# Patient Record
Sex: Female | Born: 1990 | Race: White | Hispanic: No | Marital: Married | Smoking: Former smoker
Health system: Southern US, Community
[De-identification: ages and names within clinical notes are randomized; demographics above are authoritative.]

## PROBLEM LIST (undated history)

## (undated) DIAGNOSIS — F419 Anxiety disorder, unspecified: Secondary | ICD-10-CM

## (undated) DIAGNOSIS — G43909 Migraine, unspecified, not intractable, without status migrainosus: Secondary | ICD-10-CM

## (undated) HISTORY — PX: TUBAL LIGATION: SHX77

## (undated) HISTORY — DX: Migraine, unspecified, not intractable, without status migrainosus: G43.909

## (undated) HISTORY — PX: ABDOMINAL HYSTERECTOMY: SHX81

---
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
## 2011-02-13 ENCOUNTER — Emergency Department (HOSPITAL_BASED_OUTPATIENT_CLINIC_OR_DEPARTMENT_OTHER)
Admission: EM | Admit: 2011-02-13 | Discharge: 2011-02-13 | Disposition: A | Discharge status: Home / Self Care | Payer: Medicaid Other | Attending: Emergency Medicine | Admitting: Emergency Medicine

## 2011-02-13 ENCOUNTER — Emergency Department (INDEPENDENT_AMBULATORY_CARE_PROVIDER_SITE_OTHER): Payer: Medicaid Other

## 2011-02-13 ENCOUNTER — Encounter: Payer: Self-pay | Admitting: *Deleted

## 2011-02-13 DIAGNOSIS — O208 Other hemorrhage in early pregnancy: Secondary | ICD-10-CM

## 2011-02-13 DIAGNOSIS — O2690 Pregnancy related conditions, unspecified, unspecified trimester: Secondary | ICD-10-CM | POA: Insufficient documentation

## 2011-02-13 DIAGNOSIS — O26899 Other specified pregnancy related conditions, unspecified trimester: Secondary | ICD-10-CM

## 2011-02-13 DIAGNOSIS — R109 Unspecified abdominal pain: Secondary | ICD-10-CM | POA: Insufficient documentation

## 2011-02-13 DIAGNOSIS — F17200 Nicotine dependence, unspecified, uncomplicated: Secondary | ICD-10-CM | POA: Insufficient documentation

## 2011-02-13 LAB — URINALYSIS, ROUTINE W REFLEX MICROSCOPIC
Bilirubin Urine: NEGATIVE
Glucose, UA: NEGATIVE mg/dL
Hgb urine dipstick: NEGATIVE
Ketones, ur: NEGATIVE mg/dL
Leukocytes, UA: NEGATIVE
Nitrite: NEGATIVE
Protein, ur: 30 mg/dL — AB
Specific Gravity, Urine: 1.028 (ref 1.005–1.030)
Urobilinogen, UA: 0.2 mg/dL (ref 0.0–1.0)
pH: 5.5 (ref 5.0–8.0)

## 2011-02-13 LAB — WET PREP, GENITAL

## 2011-02-13 LAB — CBC
Hemoglobin: 13.5 g/dL (ref 12.0–15.0)
MCH: 30.4 pg (ref 26.0–34.0)
MCV: 86.5 fL (ref 78.0–100.0)
Platelets: 197 10*3/uL (ref 150–400)
RBC: 4.44 MIL/uL (ref 3.87–5.11)
WBC: 8 10*3/uL (ref 4.0–10.5)

## 2011-02-13 MED ORDER — PRENATAL COMPLETE 14-0.4 MG PO TABS: 1.0000 | ORAL_TABLET | ORAL | Status: DC

## 2011-02-13 NOTE — ED Notes (Signed)
Pt says she has been experiencing bleeding with clots today. Positive pregnancy test last week

## 2011-02-13 NOTE — ED Provider Notes (Addendum)
History     Chief Complaint  Patient presents with  . Vaginal Bleeding   Patient is a 20 y.o. female presenting with vaginal bleeding. The history is provided by the patient.  Vaginal Bleeding This is a new problem. The current episode started today. The problem occurs constantly. The problem has been unchanged. Associated symptoms include abdominal pain. The symptoms are aggravated by nothing. She has tried nothing for the symptoms. The treatment provided no relief.  Vaginal Bleeding This is a new problem. The current episode started today. The problem occurs constantly. The problem has been unchanged. Associated symptoms include abdominal pain. The symptoms are aggravated by nothing. She has tried nothing for the symptoms. The treatment provided no relief.  Pt reports she is early pregnant.  Pt reports she was passing clots today.   History reviewed. No pertinent past medical history.  History reviewed. No pertinent past surgical history.  No family history on file.  History  Substance Use Topics  . Smoking status: Current Some Day Smoker  . Smokeless tobacco: Not on file  . Alcohol Use: Yes    OB History    Grav Para Term Preterm Abortions TAB SAB Ect Mult Living   1               Review of Systems  Gastrointestinal: Positive for abdominal pain.  Genitourinary: Positive for vaginal bleeding.    Physical Exam  BP 106/62  Pulse 63  Temp(Src) 97.8 F (36.6 C) (Oral)  Resp 16  SpO2 99%  LMP 01/04/2011  Physical Exam  Constitutional: She is oriented to person, place, and time. She appears well-developed and well-nourished.  HENT:  Head: Normocephalic.  Eyes: Pupils are equal, round, and reactive to light.  Neck: Normal range of motion.  Cardiovascular: Normal rate.   Pulmonary/Chest: Effort normal.  Abdominal: Soft. Bowel sounds are normal.  Genitourinary: Vagina normal and uterus normal.  Musculoskeletal: Normal range of motion.  Neurological: She is alert and  oriented to person, place, and time.  Skin: Skin is warm and dry.  Psychiatric: She has a normal mood and affect.    ED Course  Procedures  MDM  Ultrasound shows 5 week 5 day iup due 3/27  This pt has no bleeding on exam.  Medical screening examination/treatment/procedure(s) were performed by non-physician practitioner and as supervising physician I was immediately available for consultation/collaboration. Osvaldo Human, M.D.     [place], [place] 02/13/11 1747  Langston Masker, PA 02/13/11 1751  Langston Masker, [place] 02/13/11 1751  Carleene Cooper III, MD 02/14/11 1116  Carleene Cooper III, MD 03/24/11 1115

## 2011-02-14 LAB — GC/CHLAMYDIA PROBE AMP, GENITAL: GC Probe Amp, Genital: NEGATIVE

## 2011-03-01 ENCOUNTER — Emergency Department (HOSPITAL_BASED_OUTPATIENT_CLINIC_OR_DEPARTMENT_OTHER)
Admission: EM | Admit: 2011-03-01 | Discharge: 2011-03-01 | Disposition: A | Discharge status: Home / Self Care | Payer: Medicaid Other | Attending: Emergency Medicine | Admitting: Emergency Medicine

## 2011-03-01 ENCOUNTER — Encounter (HOSPITAL_BASED_OUTPATIENT_CLINIC_OR_DEPARTMENT_OTHER): Payer: Self-pay | Admitting: *Deleted

## 2011-03-01 DIAGNOSIS — O200 Threatened abortion: Secondary | ICD-10-CM | POA: Insufficient documentation

## 2011-03-01 LAB — URINALYSIS, ROUTINE W REFLEX MICROSCOPIC
Glucose, UA: NEGATIVE mg/dL
Ketones, ur: NEGATIVE mg/dL
Leukocytes, UA: NEGATIVE
Protein, ur: 30 mg/dL — AB
Urobilinogen, UA: 0.2 mg/dL (ref 0.0–1.0)

## 2011-03-01 LAB — WET PREP, GENITAL: Yeast Wet Prep HPF POC: NONE SEEN

## 2011-03-01 LAB — URINE MICROSCOPIC-ADD ON

## 2011-03-01 LAB — PREGNANCY, URINE: Preg Test, Ur: POSITIVE

## 2011-03-01 MED ORDER — METRONIDAZOLE 500 MG PO TABS: 500.0000 mg | ORAL_TABLET | Freq: Two times a day (BID) | ORAL | Status: AC

## 2011-03-01 MED ORDER — TRIAMCINOLONE ACETONIDE 0.025 % EX OINT: TOPICAL_OINTMENT | Freq: Two times a day (BID) | CUTANEOUS | Status: AC

## 2011-03-01 NOTE — ED Notes (Signed)
Pt st's she is [redacted] weeks pregnant took a shower around 1630 and noticed copious amounts of dark brown blood.  St's she noticed no clots.  St's she's having no pain.

## 2011-03-01 NOTE — ED Provider Notes (Signed)
History     CSN: [id number] Arrival date & time: 03/01/2011  6:04 PM  Chief Complaint  Patient presents with  . Vaginal Bleeding   HPI  Pt believes she is [redacted] weeks pregnant, although does not have an OB and IUP has not been confirmed.  Developed vaginal bleeding while in shower this am and has had some light bleeding since then.  No associated abd/pelvic pain or any other vaginal symptoms.  Had bleeding three weeks ago as well that resolved spontaneously.  Per prior chart, pt seen 02/13/11.  [place] confirmed IUP but did not have Rh status checked.    History reviewed. No pertinent past medical history.  History reviewed. No pertinent past surgical history.  No family history on file.  History  Substance Use Topics  . Smoking status: Former Games developer  . Smokeless tobacco: Not on file  . Alcohol Use: No    OB History    Grav Para Term Preterm Abortions TAB SAB Ect Mult Living   1               Review of Systems  All other systems reviewed and are negative.    Physical Exam  BP 114/72  Pulse 71  Temp(Src) 98.4 F (36.9 C) (Oral)  Resp 16  SpO2 100%  LMP 01/04/2011  Physical Exam  Nursing note and vitals reviewed. Constitutional: She is oriented to person, place, and time. She appears well-developed and well-nourished. No distress.  HENT:  Head: Normocephalic and atraumatic.  Eyes:       Normal appearance  Neck: Normal range of motion.  Cardiovascular: Normal rate and regular rhythm.   Pulmonary/Chest: Effort normal and breath sounds normal.  Abdominal: Soft. Bowel sounds are normal. She exhibits no distension and no mass. There is no tenderness. There is no rebound and no guarding.       No CVA tenderness  Genitourinary: Cervix exhibits no motion tenderness and no discharge. Right adnexum displays no mass and no tenderness. Left adnexum displays no mass and no tenderness. There is bleeding around the vagina. No vaginal discharge found.       Cervix closed  Neurological:  She is alert and oriented to person, place, and time.  Skin: Skin is warm and dry. No rash noted.  Psychiatric: She has a normal mood and affect. Her behavior is normal.    ED Course  Procedures  MDM Pregnant pt presents w/ non-painful vaginal bleeding since this am.  IUP confirmed at last ED visit and pt in process of finding an OB/GYN.  On exam,  no abd/pelvic/adnexal ttp, cervix closed, Vaginal bleeding.  Likely threatened abortion.  ABO/RH ordered and sent out.  Will contact pt in am if Rh neg.  Advised rest, abstinence and close OB f/u.    Pt is Rh neg.  11:40 PM       Otilio Miu, PA 03/02/11 2340

## 2011-03-01 NOTE — ED Notes (Signed)
8 weeks preg. Vaginal bleeding. Denies cramping or pain.

## 2011-03-02 LAB — GC/CHLAMYDIA PROBE AMP, GENITAL: GC Probe Amp, Genital: NEGATIVE

## 2011-03-04 NOTE — ED Provider Notes (Signed)
History/physical exam/procedure(s) were performed by non-physician practitioner and as supervising physician I was immediately available for consultation/collaboration. I have reviewed all notes and am in agreement with care and plan.   Hilario Quarry, MD 03/04/11 [phone]

## 2011-03-21 ENCOUNTER — Telehealth (HOSPITAL_BASED_OUTPATIENT_CLINIC_OR_DEPARTMENT_OTHER): Payer: Self-pay | Admitting: Emergency Medicine

## 2012-03-03 ENCOUNTER — Emergency Department (HOSPITAL_BASED_OUTPATIENT_CLINIC_OR_DEPARTMENT_OTHER)
Admission: EM | Admit: 2012-03-03 | Discharge: 2012-03-03 | Disposition: A | Discharge status: Home / Self Care | Payer: Medicaid Other | Attending: Emergency Medicine | Admitting: Emergency Medicine

## 2012-03-03 DIAGNOSIS — O209 Hemorrhage in early pregnancy, unspecified: Secondary | ICD-10-CM | POA: Insufficient documentation

## 2012-03-03 DIAGNOSIS — O200 Threatened abortion: Secondary | ICD-10-CM

## 2012-03-03 DIAGNOSIS — Z87891 Personal history of nicotine dependence: Secondary | ICD-10-CM | POA: Insufficient documentation

## 2012-03-03 DIAGNOSIS — O4690 Antepartum hemorrhage, unspecified, unspecified trimester: Secondary | ICD-10-CM

## 2012-03-03 DIAGNOSIS — N72 Inflammatory disease of cervix uteri: Secondary | ICD-10-CM

## 2012-03-03 LAB — BASIC METABOLIC PANEL
BUN: 8 mg/dL (ref 6–23)
Calcium: 9.6 mg/dL (ref 8.4–10.5)
Creatinine, Ser: 0.6 mg/dL (ref 0.50–1.10)
GFR calc non Af Amer: >90 mL/min (ref >90)
Glucose, Bld: 100 mg/dL — ABNORMAL HIGH (ref 70–99)
Potassium: 3.3 mEq/L — ABNORMAL LOW (ref 3.5–5.1)

## 2012-03-03 LAB — URINE MICROSCOPIC-ADD ON

## 2012-03-03 LAB — URINALYSIS, ROUTINE W REFLEX MICROSCOPIC
Glucose, UA: NEGATIVE mg/dL
Leukocytes, UA: NEGATIVE
Specific Gravity, Urine: 1.024 (ref 1.005–1.030)
pH: 6 (ref 5.0–8.0)

## 2012-03-03 LAB — CBC WITH DIFFERENTIAL/PLATELET
Basophils Absolute: 0 10*3/uL (ref 0.0–0.1)
Basophils Relative: 0 % (ref 0–1)
Eosinophils Absolute: 0.1 10*3/uL (ref 0.0–0.7)
Eosinophils Relative: 1 % (ref 0–5)
HCT: 33.8 % — ABNORMAL LOW (ref 36.0–46.0)
MCHC: 35.2 g/dL (ref 30.0–36.0)
MCV: 86.9 fL (ref 78.0–100.0)
Monocytes Absolute: 0.7 10*3/uL (ref 0.1–1.0)
Platelets: 202 10*3/uL (ref 150–400)
RDW: 13.2 % (ref 11.5–15.5)

## 2012-03-03 LAB — ABO/RH: ABO/RH(D): O POS

## 2012-03-03 MED ORDER — LIDOCAINE HCL (PF) 1 % IJ SOLN
INTRAMUSCULAR | Status: AC
  Administered 2012-03-03: 5 mL
  Filled 2012-03-03: qty 5

## 2012-03-03 MED ORDER — AZITHROMYCIN 1 G PO PACK: 1.0000 g | PACK | Freq: Once | ORAL | Status: DC

## 2012-03-03 MED ORDER — AZITHROMYCIN 250 MG PO TABS
ORAL_TABLET | ORAL | Status: AC
  Administered 2012-03-03: 1000 mg
  Filled 2012-03-03: qty 4

## 2012-03-03 MED ORDER — CEFTRIAXONE SODIUM 250 MG IJ SOLR
250.0000 mg | Freq: Once | INTRAMUSCULAR | Status: AC
  Administered 2012-03-03: 250 mg via INTRAMUSCULAR
  Filled 2012-03-03: qty 250

## 2012-03-03 NOTE — ED Notes (Signed)
Patient states that she is [redacted] weeks pregnant and a few hours ago began having vaginal bleeding.

## 2012-03-03 NOTE — ED Provider Notes (Addendum)
History     CSN: [id number]  Arrival date & time 03/03/12  0404   First MD Initiated Contact with Patient 03/03/12 0424      Chief Complaint  Patient presents with  . Vaginal Bleeding    (Consider location/radiation/quality/duration/timing/severity/associated sxs/prior treatment) Patient is a 21 y.o. female presenting with vaginal bleeding. The history is provided by the patient. No language interpreter was used.  Vaginal Bleeding This is a new problem. The current episode started 3 to 5 hours ago. The problem occurs constantly. The problem has not changed since onset.Pertinent negatives include no chest pain, no headaches (No f/c/r.  No urinary symptoms.  ) and no shortness of breath. Nothing aggravates the symptoms. Nothing relieves the symptoms. She has tried nothing for the symptoms. The treatment provided no relief.  States she is six weeks pregnant by LMP and [place] in her GYNs office.  Denies pain or trauma  No past medical history on file.  No past surgical history on file.  No family history on file.  History  Substance Use Topics  . Smoking status: Former Games developer  . Smokeless tobacco: Not on file  . Alcohol Use: No    OB History    Grav Para Term Preterm Abortions TAB SAB Ect Mult Living   1               Review of Systems  Respiratory: Negative for shortness of breath.   Cardiovascular: Negative for chest pain.  Genitourinary: Positive for vaginal bleeding. Negative for dysuria, urgency, vaginal discharge, vaginal pain and pelvic pain.  Neurological: Negative for headaches (No f/c/r.  No urinary symptoms.  ).  All other systems reviewed and are negative.    Allergies  Review of patient's allergies indicates no known allergies.  Home Medications   Current Outpatient Rx  Name Route Sig Dispense Refill  . PRENATAL COMPLETE 14-0.4 MG PO TABS Oral Take 1 tablet by mouth 1 day or 1 dose. 60 each 0    BP 122/65  Pulse 81  Temp 98.1 F (36.7 C) (Oral)  Resp  20  SpO2 100%  LMP 01/04/2011  Physical Exam  Constitutional: She appears well-developed and well-nourished. No distress.  HENT:  Head: Normocephalic and atraumatic.  Eyes: Conjunctivae are normal. Pupils are equal, round, and reactive to light.  Neck: Normal range of motion. Neck supple.  Cardiovascular: Normal rate and regular rhythm.   Pulmonary/Chest: Effort normal. She has no wheezes. She has no rales.  Abdominal: Soft. Bowel sounds are normal. There is no tenderness. There is no rebound and no guarding.  Genitourinary: There is bleeding around the vagina.       Scant bleeding per os no CMT no adnexal mass and tenderness chaperone present  Musculoskeletal: Normal range of motion.    ED Course  Procedures (including critical care time)  Labs Reviewed  CBC WITH DIFFERENTIAL - Abnormal; Notable for the following:    Hemoglobin 11.9 (*)     HCT 33.8 (*)     All other components within normal limits  BASIC METABOLIC PANEL - Abnormal; Notable for the following:    Potassium 3.3 (*)     Glucose, Bld 100 (*)     All other components within normal limits  PREGNANCY, URINE - Abnormal; Notable for the following:    Preg Test, Ur POSITIVE (*)     All other components within normal limits  HCG, QUANTITATIVE, PREGNANCY - Abnormal; Notable for the following:    hCG, Beta Chain,  Quant, S 1610 (*)     All other components within normal limits  WET PREP, GENITAL - Abnormal; Notable for the following:    Clue Cells Wet Prep HPF POC FEW (*)     WBC, Wet Prep HPF POC TOO NUMEROUS TO COUNT (*)     All other components within normal limits  URINALYSIS, ROUTINE W REFLEX MICROSCOPIC - Abnormal; Notable for the following:    Hgb urine dipstick LARGE (*)     Ketones, ur 15 (*)     All other components within normal limits  URINE MICROSCOPIC-ADD ON - Abnormal; Notable for the following:    Squamous Epithelial / LPF FEW (*)     Bacteria, UA MANY (*)     All other components within normal  limits  ABO/RH  GC/CHLAMYDIA PROBE AMP, GENITAL  URINE CULTURE   No results found.   No diagnosis found.    MDM  Original BP spurious with coat on, suspect error.  Case d/w Dr. Christella Hartigan of [facility].  Was seen 02/28/12 and had confirmed early IUP on [place] in the office.  Send urine for culture.  Follow up on Monday in office  Patient instructed we will treat for GC and chlamydia as current partner is a new partner.  No sexual activity of any kind until cleared by GYN, no douching, no tampons strict pelvic rest until cleared by GYN.  Partner must be treated for STI at [facility].  Return immediately to the closes ED for bleeding > 1 pad per hours or any concerns.  Patient verbalizes understanding and agrees to follow up        Deyonte Cadden K Maryjo Ragon-Rasch, MD 03/03/12 [id number]  Jasmine Awe, MD 03/03/12 [phone]

## 2012-03-05 LAB — URINE CULTURE

## 2013-10-25 ENCOUNTER — Emergency Department (HOSPITAL_BASED_OUTPATIENT_CLINIC_OR_DEPARTMENT_OTHER)
Admission: EM | Admit: 2013-10-25 | Discharge: 2013-10-25 | Disposition: A | Discharge status: Home / Self Care | Payer: Medicaid Other | Attending: Emergency Medicine | Admitting: Emergency Medicine

## 2013-10-25 ENCOUNTER — Encounter (HOSPITAL_BASED_OUTPATIENT_CLINIC_OR_DEPARTMENT_OTHER): Payer: Self-pay | Admitting: Emergency Medicine

## 2013-10-25 DIAGNOSIS — M62830 Muscle spasm of back: Secondary | ICD-10-CM

## 2013-10-25 DIAGNOSIS — Z87891 Personal history of nicotine dependence: Secondary | ICD-10-CM | POA: Insufficient documentation

## 2013-10-25 DIAGNOSIS — M533 Sacrococcygeal disorders, not elsewhere classified: Secondary | ICD-10-CM | POA: Insufficient documentation

## 2013-10-25 DIAGNOSIS — M62838 Other muscle spasm: Secondary | ICD-10-CM | POA: Insufficient documentation

## 2013-10-25 MED ORDER — OXYCODONE-ACETAMINOPHEN 5-325 MG PO TABS
1.0000 | ORAL_TABLET | Freq: Once | ORAL | Status: AC
  Administered 2013-10-25: 1 via ORAL
  Filled 2013-10-25: qty 1

## 2013-10-25 MED ORDER — OXYCODONE-ACETAMINOPHEN 5-325 MG PO TABS: 1.0000 | ORAL_TABLET | Freq: Four times a day (QID) | ORAL | Status: DC | PRN

## 2013-10-25 MED ORDER — METHOCARBAMOL 500 MG PO TABS
1000.0000 mg | ORAL_TABLET | Freq: Once | ORAL | Status: AC
  Administered 2013-10-25: 1000 mg via ORAL
  Filled 2013-10-25: qty 2

## 2013-10-25 MED ORDER — METHOCARBAMOL 500 MG PO TABS
500.0000 mg | ORAL_TABLET | Freq: Two times a day (BID) | ORAL | Status: DC
Start: 2013-10-25 — End: 2016-05-04

## 2013-10-25 MED ORDER — MELOXICAM 7.5 MG PO TABS: 7.5000 mg | ORAL_TABLET | Freq: Every day | ORAL | Status: DC

## 2013-10-25 MED ORDER — KETOROLAC TROMETHAMINE 60 MG/2ML IM SOLN
60.0000 mg | Freq: Once | INTRAMUSCULAR | Status: AC
  Administered 2013-10-25: 60 mg via INTRAMUSCULAR
  Filled 2013-10-25: qty 2

## 2013-10-25 NOTE — ED Provider Notes (Signed)
CSN: [id number]     Arrival date & time 10/25/13  2125 History  This chart was scribed for Joram Venson Smitty CordsK Jahsiah Carpenter-Rasch, MD by Bronson CurbJacqueline Melvin, ED Scribe. This patient was seen in room MH01/MH01 and the patient's care was started at 11:13 PM.  Chief Complaint  Patient presents with  . Back Pain    Patient is a 23 y.o. female presenting with back pain. The history is provided by the patient. No language interpreter was used.  Back Pain Location:  Sacro-iliac joint Radiates to:  Does not radiate Pain severity:  Moderate Pain is:  Same all the time Duration:  4 days Timing:  Constant Progression:  Unchanged Chronicity:  New Context: not emotional stress   Relieved by:  Nothing Worsened by:  Nothing tried Ineffective treatments:  None tried Associated symptoms: no bladder incontinence, no bowel incontinence, no dysuria, no fever, no numbness, no tingling and no weakness   Risk factors: no hx of cancer and no lack of exercise    HPI Comments: Rachael Pope is a 23 y.o. female who presents to the Emergency Department complaining of moderate, right lower back pain that began 4 days ago. Patient cannot remember what she was doing when pain began and she denies any specific injury or trauma. There is no numbness or weakness of any extremities. She denies bowel or bladder incontinence, fever, dysuria, hematuria, constipation, blood in stool or any other symptoms at this time. She has no chronic medical conditions.  History reviewed. No pertinent past medical history. History reviewed. No pertinent past surgical history. No family history on file. History  Substance Use Topics  . Smoking status: Former Games developermoker  . Smokeless tobacco: Never Used  . Alcohol Use: No   OB History   Grav Para Term Preterm Abortions TAB SAB Ect Mult Living   1              Review of Systems  Constitutional: Negative for fever.  Gastrointestinal: Negative for blood in stool and bowel incontinence.   Genitourinary: Negative for bladder incontinence, dysuria, hematuria and difficulty urinating.  Musculoskeletal: Positive for back pain.  Neurological: Negative for tingling, weakness and numbness.  All other systems reviewed and are negative.     Allergies  Review of patient's allergies indicates no known allergies.  Home Medications   Current Outpatient Rx  Name  Route  Sig  Dispense  Refill  . Prenatal Vit-Fe Fumarate-FA (PRENATAL COMPLETE) 14-0.4 MG TABS   Oral   Take 1 tablet by mouth 1 day or 1 dose.   60 each   0    Triage Vitals: BP 115/67  Pulse 74  Temp(Src) 98 F (36.7 C) (Oral)  Resp 18  Ht 5\' 10"  (1.778 m)  Wt 155 lb (70.308 kg)  BMI 22.24 kg/m2  SpO2 100%  Breastfeeding? Unknown Physical Exam  Constitutional: She is oriented to person, place, and time. She appears well-developed and well-nourished. No distress.  HENT:  Head: Normocephalic and atraumatic.  Mouth/Throat: Oropharynx is clear and moist. No oropharyngeal exudate.  Eyes: Conjunctivae and EOM are normal. Pupils are equal, round, and reactive to light.  Neck: Normal range of motion. Neck supple. No tracheal deviation present.  Cardiovascular: Normal rate, regular rhythm, normal heart sounds and intact distal pulses.   Pulmonary/Chest: Effort normal and breath sounds normal. No respiratory distress. She has no wheezes. She has no rales.  Abdominal: Soft. Bowel sounds are normal. There is no tenderness. There is no rebound and no guarding.  Musculoskeletal: Normal range of motion. She exhibits no edema and no tenderness.  Spasm at the SI joint below the level of the spine. 5/5 strength of lower extremities bilaterally. L5-S1 intact. Intact perineal sensation.   Neurological: She is alert and oriented to person, place, and time. She has normal reflexes.  Skin: Skin is warm and dry.  Psychiatric: She has a normal mood and affect. Her behavior is normal.    ED Course  Procedures (including  critical care time) DIAGNOSTIC STUDIES: Oxygen Saturation is 100% on RA, normal by my interpretation.    COORDINATION OF CARE: 11:1 PM- Pt advised of plan for treatment and pt agrees.    MDM   Final diagnoses:  Back spasm    Clearly palpable spasm of muscle. Will treat for muscle spasm with NSAIDs, pain medicines, and muscle relaxants. Patient advised not to drink alcohol or use caffeine, or drive or operate any machinery under influence of narcotic medications.  I personally performed the services described in this documentation, which was scribed in my presence. The recorded information has been reviewed and is accurate.     Jasmine Awe, MD 10/26/13 [phone]

## 2013-10-25 NOTE — ED Notes (Signed)
Back pain x 4 days without specific injury noted

## 2013-10-26 ENCOUNTER — Encounter (HOSPITAL_BASED_OUTPATIENT_CLINIC_OR_DEPARTMENT_OTHER): Payer: Self-pay | Admitting: Emergency Medicine

## 2014-05-18 ENCOUNTER — Encounter (HOSPITAL_BASED_OUTPATIENT_CLINIC_OR_DEPARTMENT_OTHER): Payer: Self-pay | Admitting: Emergency Medicine

## 2014-07-06 ENCOUNTER — Encounter: Payer: Medicaid Other | Admitting: Obstetrics & Gynecology

## 2015-11-04 ENCOUNTER — Emergency Department (HOSPITAL_BASED_OUTPATIENT_CLINIC_OR_DEPARTMENT_OTHER): Payer: Managed Care, Other (non HMO)

## 2015-11-04 ENCOUNTER — Emergency Department (HOSPITAL_BASED_OUTPATIENT_CLINIC_OR_DEPARTMENT_OTHER)
Admission: EM | Admit: 2015-11-04 | Discharge: 2015-11-05 | Disposition: A | Discharge status: Home / Self Care | Payer: Managed Care, Other (non HMO) | Attending: Emergency Medicine | Admitting: Emergency Medicine

## 2015-11-04 ENCOUNTER — Encounter (HOSPITAL_BASED_OUTPATIENT_CLINIC_OR_DEPARTMENT_OTHER): Payer: Self-pay | Admitting: *Deleted

## 2015-11-04 DIAGNOSIS — Z791 Long term (current) use of non-steroidal anti-inflammatories (NSAID): Secondary | ICD-10-CM | POA: Insufficient documentation

## 2015-11-04 DIAGNOSIS — Y998 Other external cause status: Secondary | ICD-10-CM | POA: Diagnosis not present

## 2015-11-04 DIAGNOSIS — Y9389 Activity, other specified: Secondary | ICD-10-CM | POA: Insufficient documentation

## 2015-11-04 DIAGNOSIS — Z79899 Other long term (current) drug therapy: Secondary | ICD-10-CM | POA: Insufficient documentation

## 2015-11-04 DIAGNOSIS — Z041 Encounter for examination and observation following transport accident: Secondary | ICD-10-CM | POA: Diagnosis not present

## 2015-11-04 DIAGNOSIS — Z87891 Personal history of nicotine dependence: Secondary | ICD-10-CM | POA: Insufficient documentation

## 2015-11-04 DIAGNOSIS — Z8659 Personal history of other mental and behavioral disorders: Secondary | ICD-10-CM | POA: Insufficient documentation

## 2015-11-04 DIAGNOSIS — K5900 Constipation, unspecified: Secondary | ICD-10-CM | POA: Diagnosis not present

## 2015-11-04 DIAGNOSIS — R109 Unspecified abdominal pain: Secondary | ICD-10-CM | POA: Diagnosis present

## 2015-11-04 DIAGNOSIS — Y92410 Unspecified street and highway as the place of occurrence of the external cause: Secondary | ICD-10-CM | POA: Insufficient documentation

## 2015-11-04 HISTORY — DX: Anxiety disorder, unspecified: F41.9

## 2015-11-04 LAB — URINALYSIS, ROUTINE W REFLEX MICROSCOPIC
Bilirubin Urine: NEGATIVE
GLUCOSE, UA: NEGATIVE mg/dL
Hgb urine dipstick: NEGATIVE
Ketones, ur: NEGATIVE mg/dL
LEUKOCYTES UA: NEGATIVE
Nitrite: NEGATIVE
PH: 6 (ref 5.0–8.0)
PROTEIN: NEGATIVE mg/dL
Specific Gravity, Urine: 1.021 (ref 1.005–1.030)

## 2015-11-04 LAB — PREGNANCY, URINE: Preg Test, Ur: NEGATIVE

## 2015-11-04 MED ORDER — DICYCLOMINE HCL 10 MG/ML IM SOLN
20.0000 mg | Freq: Once | INTRAMUSCULAR | Status: AC
  Administered 2015-11-04: 20 mg via INTRAMUSCULAR
  Filled 2015-11-04: qty 2
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
## 2015-11-05 ENCOUNTER — Encounter (HOSPITAL_BASED_OUTPATIENT_CLINIC_OR_DEPARTMENT_OTHER): Payer: Self-pay | Admitting: Emergency Medicine

## 2015-11-05 MED ORDER — POLYETHYLENE GLYCOL 3350 17 GM/SCOOP PO POWD: 17.0000 g | Freq: Every day | ORAL | Status: DC

## 2015-11-05 NOTE — Discharge Instructions (Signed)

## 2016-05-04 ENCOUNTER — Encounter (HOSPITAL_BASED_OUTPATIENT_CLINIC_OR_DEPARTMENT_OTHER): Payer: Self-pay | Admitting: *Deleted

## 2016-05-04 ENCOUNTER — Emergency Department (HOSPITAL_BASED_OUTPATIENT_CLINIC_OR_DEPARTMENT_OTHER): Payer: Managed Care, Other (non HMO)

## 2016-05-04 ENCOUNTER — Emergency Department (HOSPITAL_BASED_OUTPATIENT_CLINIC_OR_DEPARTMENT_OTHER)
Admission: EM | Admit: 2016-05-04 | Discharge: 2016-05-04 | Disposition: A | Discharge status: Home / Self Care | Payer: Managed Care, Other (non HMO) | Attending: Emergency Medicine | Admitting: Emergency Medicine

## 2016-05-04 DIAGNOSIS — Y929 Unspecified place or not applicable: Secondary | ICD-10-CM | POA: Insufficient documentation

## 2016-05-04 DIAGNOSIS — S93401A Sprain of unspecified ligament of right ankle, initial encounter: Secondary | ICD-10-CM | POA: Insufficient documentation

## 2016-05-04 DIAGNOSIS — Y999 Unspecified external cause status: Secondary | ICD-10-CM | POA: Diagnosis not present

## 2016-05-04 DIAGNOSIS — W010XXA Fall on same level from slipping, tripping and stumbling without subsequent striking against object, initial encounter: Secondary | ICD-10-CM | POA: Diagnosis not present

## 2016-05-04 DIAGNOSIS — M25572 Pain in left ankle and joints of left foot: Secondary | ICD-10-CM | POA: Insufficient documentation

## 2016-05-04 DIAGNOSIS — Z87891 Personal history of nicotine dependence: Secondary | ICD-10-CM | POA: Insufficient documentation

## 2016-05-04 DIAGNOSIS — Y939 Activity, unspecified: Secondary | ICD-10-CM | POA: Insufficient documentation

## 2016-05-04 DIAGNOSIS — S99911A Unspecified injury of right ankle, initial encounter: Secondary | ICD-10-CM | POA: Diagnosis present

## 2016-05-04 MED ORDER — IBUPROFEN 800 MG PO TABS: 800.0000 mg | ORAL_TABLET | Freq: Three times a day (TID) | ORAL | 0 refills | Status: AC | PRN

## 2016-05-04 MED ORDER — IBUPROFEN 800 MG PO TABS
800.0000 mg | ORAL_TABLET | Freq: Once | ORAL | Status: AC
  Administered 2016-05-04: 800 mg via ORAL
  Filled 2016-05-04: qty 1

## 2016-05-04 NOTE — ED Provider Notes (Signed)
[facility]-EMERGENCY DEPT Provider Note   CSN: [id number] Arrival date & time: 05/04/16  1841 By signing my name below, I, Levon Hedger, attest that this documentation has been prepared under the direction and in the presence of No att. providers found . Electronically Signed: Levon Hedger, Scribe. 05/04/2016. 10:08 PM.   History   Chief Complaint Chief Complaint  Patient presents with  . Ankle Injury    HPI Rachael Pope is a 25 y.o. female who presents to the Emergency Department complaining of sudden onset, moderate bilateral (R>L) ankle pain s/p fall today. She states her heels became lodged in a snag in the carpet, causing her to roll her ankles and fall. No alleviating or modifying factors noted.  She has no other complaints at this time.   The history is provided by the patient. No language interpreter was used.   Past Medical History:  Diagnosis Date  . Anxiety     There are no active problems to display for this patient.   History reviewed. No pertinent surgical history.  OB History    Gravida Para Term Preterm AB Living   1             SAB TAB Ectopic Multiple Live Births                 Home Medications    Prior to Admission medications   Medication Sig Start Date End Date Taking? Authorizing Provider  ibuprofen (ADVIL,MOTRIN) 800 MG tablet Take 1 tablet (800 mg total) by mouth every 8 (eight) hours as needed. 05/04/16   Marily Memos, MD  polyethylene glycol powder (MIRALAX) powder Take 17 g by mouth daily. 11/05/15   April Palumbo, MD  Prenatal Vit-Fe Fumarate-FA (PRENATAL COMPLETE) 14-0.4 MG TABS Take 1 tablet by mouth 1 day or 1 dose. 02/13/11   Elson Areas, PA-C   Family History No family history on file.  Social History Social History  Substance Use Topics  . Smoking status: Former Games developer  . Smokeless tobacco: Never Used  . Alcohol use No   Allergies   Review of patient's allergies indicates no known allergies.  Review of Systems Review of  Systems  Musculoskeletal: Positive for arthralgias and joint swelling.  Skin: Negative for wound.  All other systems reviewed and are negative.  Physical Exam Updated Vital Signs BP 115/74   Pulse 81   Temp 98.1 F (36.7 C) (Oral)   Resp 20   Ht 5\' 10"  (1.778 m)   Wt 145 lb (65.8 kg)   LMP 04/22/2016   SpO2 98%   BMI 20.81 kg/m   Physical Exam  Constitutional: She is oriented to person, place, and time. She appears well-developed and well-nourished. No distress.  HENT:  Head: Normocephalic and atraumatic.  Eyes: Conjunctivae are normal.  Cardiovascular: Normal rate.   Pulmonary/Chest: Effort normal.  Abdominal: She exhibits no distension.  Musculoskeletal: She exhibits tenderness.  R>L ankle pain; normal ROM. No proximal fibula tenderness   Neurological: She is alert and oriented to person, place, and time.  Skin: Skin is warm and dry.  Psychiatric: She has a normal mood and affect.  Nursing note and vitals reviewed.  ED Treatments / Results  DIAGNOSTIC STUDIES:  Oxygen Saturation is 98% on RA, normal by my interpretation.    COORDINATION OF CARE:  10:06 PM Discussed treatment plan which includes ibuprofen with pt at bedside and pt agreed to plan.  Labs (all labs ordered are listed, but only abnormal results are displayed)  Labs Reviewed - No data to display  EKG  EKG Interpretation None       Radiology Dg Ankle Complete Left  Result Date: 05/04/2016 CLINICAL DATA:  Fall, bilateral ankle pain. EXAM: LEFT ANKLE COMPLETE - 3+ VIEW COMPARISON:  None. FINDINGS: No fracture deformity nor dislocation. The ankle mortise appears congruent and the tibiofibular syndesmosis intact. No destructive bony lesions. Soft tissue planes are non-suspicious. IMPRESSION: Negative. Electronically Signed   By: Awilda Metroourtnay  Bloomer M.D.   On: 05/04/2016 19:46   Dg Ankle Complete Right  Result Date: 05/04/2016 CLINICAL DATA:  Larey SeatFell, twisting both ankles.  Anterior pain. EXAM: RIGHT  ANKLE - COMPLETE 3+ VIEW COMPARISON:  None. FINDINGS: There is no evidence of fracture, dislocation, or joint effusion. There is no evidence of arthropathy or other focal bone abnormality. Soft tissues are unremarkable. IMPRESSION: Negative. Electronically Signed   By: Paulina FusiMark  Shogry M.D.   On: 05/04/2016 19:41    Procedures Procedures (including critical care time)  Medications Ordered in ED Medications  ibuprofen (ADVIL,MOTRIN) tablet 800 mg (800 mg Oral Given 05/04/16 2241)     Initial Impression / Assessment and Plan / ED Course  I have reviewed the triage vital signs and the nursing notes.  Pertinent labs & imaging results that were available during my care of the patient were reviewed by me and considered in my medical decision making (see chart for details).  Clinical Course    25 yo F who presents to ED with R>L ankle pain and swelling after fall with inversion. On exam has pain with ROM, slight swelling, pain with palpation of malleolus. Difficulty bearing weight for more than a couple steps. So XR done to evaluate for fracture and negative. No proximal fibular ttp to suggest fracture or unstable injury. No laxity in ankle to suggest severe grade 3 sprain.  Plan for air splint, ace wrap. NSAIDs for pain. Will also employ RICE therapy and weight bearing as tolerated. If symptoms not improving in one week, will plan to follow up with PCP or orthopedics for repeat imaging to evaluate for occult fracture.    Final Clinical Impressions(s) / ED Diagnoses   Final diagnoses:  Sprain of right ankle, unspecified ligament, initial encounter    New Prescriptions Discharge Medication List as of 05/04/2016 10:10 PM    START taking these medications   Details  ibuprofen (ADVIL,MOTRIN) 800 MG tablet Take 1 tablet (800 mg total) by mouth every 8 (eight) hours as needed., Starting Thu 05/04/2016, Print       I personally performed the services described in this documentation, which was  scribed in my presence. The recorded information has been reviewed and is accurate.     Marily MemosJason Chalmer Zheng, MD 05/06/16 [phone]

## 2016-05-04 NOTE — ED Notes (Signed)
Pt verbalizes understanding of d/c instructions and denies any further needs at this time. 

## 2016-05-04 NOTE — ED Triage Notes (Signed)
Earlier today her heels got caught in carpet tripping her. She fell. Injury to right and left ankles.

## 2018-04-15 ENCOUNTER — Encounter (HOSPITAL_BASED_OUTPATIENT_CLINIC_OR_DEPARTMENT_OTHER): Payer: Self-pay

## 2018-04-15 ENCOUNTER — Emergency Department (HOSPITAL_BASED_OUTPATIENT_CLINIC_OR_DEPARTMENT_OTHER)
Admission: EM | Admit: 2018-04-15 | Discharge: 2018-04-15 | Disposition: A | Discharge status: Home / Self Care | Payer: Managed Care, Other (non HMO) | Attending: Emergency Medicine | Admitting: Emergency Medicine

## 2018-04-15 ENCOUNTER — Emergency Department (HOSPITAL_BASED_OUTPATIENT_CLINIC_OR_DEPARTMENT_OTHER): Payer: Managed Care, Other (non HMO)

## 2018-04-15 DIAGNOSIS — R202 Paresthesia of skin: Secondary | ICD-10-CM | POA: Diagnosis not present

## 2018-04-15 DIAGNOSIS — R079 Chest pain, unspecified: Secondary | ICD-10-CM

## 2018-04-15 DIAGNOSIS — R21 Rash and other nonspecific skin eruption: Secondary | ICD-10-CM | POA: Insufficient documentation

## 2018-04-15 DIAGNOSIS — Z87891 Personal history of nicotine dependence: Secondary | ICD-10-CM | POA: Insufficient documentation

## 2018-04-15 DIAGNOSIS — B084 Enteroviral vesicular stomatitis with exanthem: Secondary | ICD-10-CM | POA: Insufficient documentation

## 2018-04-15 DIAGNOSIS — K0889 Other specified disorders of teeth and supporting structures: Secondary | ICD-10-CM | POA: Diagnosis present

## 2018-04-15 LAB — CBC
HEMATOCRIT: 37.6 % (ref 36.0–46.0)
Hemoglobin: 12.8 g/dL (ref 12.0–15.0)
MCH: 29.7 pg (ref 26.0–34.0)
MCHC: 34 g/dL (ref 30.0–36.0)
MCV: 87.2 fL (ref 78.0–100.0)
Platelets: 165 10*3/uL (ref 150–400)
RBC: 4.31 MIL/uL (ref 3.87–5.11)
RDW: 13.1 % (ref 11.5–15.5)
WBC: 4.6 10*3/uL (ref 4.0–10.5)

## 2018-04-15 LAB — COMPREHENSIVE METABOLIC PANEL
ALBUMIN: 4.2 g/dL (ref 3.5–5.0)
ALT: 11 U/L (ref 0–44)
AST: 12 U/L — AB (ref 15–41)
Alkaline Phosphatase: 50 U/L (ref 38–126)
Anion gap: 10 (ref 5–15)
BILIRUBIN TOTAL: 1.4 mg/dL — AB (ref 0.3–1.2)
BUN: 7 mg/dL (ref 6–20)
CO2: 28 mmol/L (ref 22–32)
Calcium: 8.6 mg/dL — ABNORMAL LOW (ref 8.9–10.3)
Chloride: 103 mmol/L (ref 98–111)
Creatinine, Ser: 0.63 mg/dL (ref 0.44–1.00)
GFR calc Af Amer: >60 mL/min (ref >60)
GFR calc non Af Amer: >60 mL/min (ref >60)
GLUCOSE: 77 mg/dL (ref 70–99)
POTASSIUM: 2.9 mmol/L — AB (ref 3.5–5.1)
Sodium: 141 mmol/L (ref 135–145)
TOTAL PROTEIN: 7 g/dL (ref 6.5–8.1)

## 2018-04-15 LAB — TROPONIN I

## 2018-04-15 MED ORDER — LIDOCAINE VISCOUS HCL 2 % MT SOLN
15.0000 mL | Freq: Once | OROMUCOSAL | Status: AC
  Administered 2018-04-15: 15 mL via OROMUCOSAL
  Filled 2018-04-15: qty 15

## 2018-04-15 MED ORDER — DEXAMETHASONE 4 MG PO TABS: 8.0000 mg | ORAL_TABLET | Freq: Once | ORAL | Status: DC

## 2018-04-15 NOTE — ED Triage Notes (Signed)
Pt c/o swelling feeling to inside of mouth and lips, pain to hands and rash to palms

## 2018-04-15 NOTE — Discharge Instructions (Addendum)
You were evaluated in the Emergency Department and after careful evaluation, we did not find any emergent condition requiring admission or further testing in the hospital.  Your symptoms today seem to be due to hand-foot-and-mouth disease.  This is due to a virus, and the pain is usually short-lived, usually to 3 days.  With your breast-feeding, our pain management options are limited.  Please use Tylenol at home throughout the day to help with pain.  Please return to the Emergency Department if you experience any worsening of your condition.  We encourage you to follow up with a primary care provider.  Thank you for allowing [place] to be a part of your care.

## 2018-04-15 NOTE — ED Provider Notes (Signed)
[facility] Emergency Department Provider Note MRN:  [id number]  Arrival date & time: 04/15/18     Chief Complaint   Oral pain History of Present Illness   Rachael Pope is a 27 y.o. year-old female with a history of anxiety presenting to the ED with chief complaint of oral pain.  For 2 to 3 days patient has been expensing progressively worsening pain in the back of the throat as well as the gums and lips inside her mouth.  Also experiencing sensation of swelling, paresthesias to bilateral hands, rash to bilateral palms.  Patient has 3 kids, 2 of which are in daycare.  Review of Systems  A complete 10 system review of systems was obtained and all systems are negative except as noted in the HPI and PMH.   Patient's Health History    Past Medical History:  Diagnosis Date  . Anxiety     No past surgical history on file.  No family history on file.  Social History   Socioeconomic History  . Marital status: Married    Spouse name: Not on file  . Number of children: Not on file  . Years of education: Not on file  . Highest education level: Not on file  Occupational History  . Not on file  Social Needs  . Financial resource strain: Not on file  . Food insecurity:    Worry: Not on file    Inability: Not on file  . Transportation needs:    Medical: Not on file    Non-medical: Not on file  Tobacco Use  . Smoking status: Former Games developer  . Smokeless tobacco: Never Used  Substance and Sexual Activity  . Alcohol use: No  . Drug use: No  . Sexual activity: Not on file  Lifestyle  . Physical activity:    Days per week: Not on file    Minutes per session: Not on file  . Stress: Not on file  Relationships  . Social connections:    Talks on phone: Not on file    Gets together: Not on file    Attends religious service: Not on file    Active member of club or organization: Not on file    Attends meetings of clubs or organizations: Not on file   Relationship status: Not on file  . Intimate partner violence:    Fear of current or ex partner: Not on file    Emotionally abused: Not on file    Physically abused: Not on file    Forced sexual activity: Not on file  Other Topics Concern  . Not on file  Social History Narrative  . Not on file     Physical Exam  Vital Signs and Nursing Notes reviewed Vitals:   04/15/18 1027 04/15/18 1409  BP: 122/88 121/74  Pulse: 84 76  Resp: 20 14  Temp: 98.4 F (36.9 C)   SpO2: 100% 100%    CONSTITUTIONAL: Well-appearing, NAD NEURO:  Alert and oriented x 3, no focal deficits EYES:  eyes equal and reactive ENT/NECK:  no LAD, no JVD, erythema to the posterior oropharynx with scattered ulcerations CARDIO: Regular rate, well-perfused, normal S1 and S2 PULM:  CTAB no wheezing or rhonchi GI/GU:  normal bowel sounds, non-distended, non-tender MSK/SPINE:  No gross deformities, no edema SKIN:  no rash, faint scattered blanching erythematous macules to the bilateral palms PSYCH:  Appropriate speech and behavior  Diagnostic and Interventional Summary    EKG Interpretation  Date/Time:  Monday April 15 2018 12:12:24 EDT Ventricular Rate:  73 PR Interval:    QRS Duration: 102 QT Interval:  424 QTC Calculation: 468 R Axis:   -52 Text Interpretation:  Sinus rhythm LAD, consider left anterior fascicular block RSR' in V1 or V2, probably normal variant Nonspecific T abnormalities, anterior leads Confirmed by Kennis Carina [phone]) on 04/15/2018 12:57:47 PM      Labs Reviewed  COMPREHENSIVE METABOLIC PANEL - Abnormal; Notable for the following components:      Result Value   Potassium 2.9 (*)    Calcium 8.6 (*)    AST 12 (*)    Total Bilirubin 1.4 (*)    All other components within normal limits  TROPONIN I  CBC    DG Chest 2 View  Final Result      Medications  lidocaine (XYLOCAINE) 2 % viscous mouth solution 15 mL (15 mLs Mouth/Throat Given 04/15/18 1213)     Procedures Critical  Care  ED Course and Medical Decision Making  I have reviewed the triage vital signs and the nursing notes.  Pertinent labs & imaging results that were available during my care of the patient were reviewed by me and considered in my medical decision making (see below for details).    Favoring hand-foot-and-mouth disease in this 27 year old female with exposure to daycare.  Endorsing some sharp chest pain with shortness of breath prior to arrival, favored anxiety, will screen with labs, chest x-ray, EKG.  EKG unremarkable, chest x-ray unremarkable.  Labs reassuring.  Feeling some improvement with lidocaine application here.  Patient is breast-feeding, limiting our pain management options.  Tylenol at home, reassurance.    After the discussed management above, the patient was determined to be safe for discharge.  The patient was in agreement with this plan and all questions regarding their care were answered.  ED return precautions were discussed and the patient will return to the ED with any significant worsening of condition.   Elmer Sow. Pilar Plate, MD [facility] Emergency Medicine [facility] [email or web address]  Final Clinical Impressions(s) / ED Diagnoses     ICD-10-CM   1. Hand, foot and mouth disease B08.4   2. Chest pain R07.9 DG Chest 2 View    DG Chest 2 View    ED Discharge Orders    None         Sabas Sous, MD 04/15/18 1426

## 2018-04-15 NOTE — ED Notes (Signed)
Pt/family verbalized understanding of discharge instructions.   

## 2019-09-22 ENCOUNTER — Emergency Department (HOSPITAL_BASED_OUTPATIENT_CLINIC_OR_DEPARTMENT_OTHER)
Admission: EM | Admit: 2019-09-22 | Discharge: 2019-09-22 | Disposition: A | Discharge status: Home / Self Care | Payer: Managed Care, Other (non HMO) | Attending: Emergency Medicine | Admitting: Emergency Medicine

## 2019-09-22 ENCOUNTER — Encounter (HOSPITAL_BASED_OUTPATIENT_CLINIC_OR_DEPARTMENT_OTHER): Payer: Self-pay | Admitting: *Deleted

## 2019-09-22 ENCOUNTER — Emergency Department (HOSPITAL_BASED_OUTPATIENT_CLINIC_OR_DEPARTMENT_OTHER): Payer: Managed Care, Other (non HMO)

## 2019-09-22 ENCOUNTER — Other Ambulatory Visit: Payer: Self-pay

## 2019-09-22 DIAGNOSIS — Z79899 Other long term (current) drug therapy: Secondary | ICD-10-CM | POA: Insufficient documentation

## 2019-09-22 DIAGNOSIS — Z87891 Personal history of nicotine dependence: Secondary | ICD-10-CM | POA: Insufficient documentation

## 2019-09-22 DIAGNOSIS — E876 Hypokalemia: Secondary | ICD-10-CM

## 2019-09-22 DIAGNOSIS — R0789 Other chest pain: Secondary | ICD-10-CM

## 2019-09-22 LAB — CBC WITH DIFFERENTIAL/PLATELET
Abs Immature Granulocytes: 0.02 10*3/uL (ref 0.00–0.07)
Basophils Absolute: 0 10*3/uL (ref 0.0–0.1)
Basophils Relative: 1 %
Eosinophils Absolute: 0 10*3/uL (ref 0.0–0.5)
Eosinophils Relative: 0 %
HCT: 43.5 % (ref 36.0–46.0)
Hemoglobin: 14.9 g/dL (ref 12.0–15.0)
Immature Granulocytes: 0 %
Lymphocytes Relative: 29 %
Lymphs Abs: 1.8 10*3/uL (ref 0.7–4.0)
MCH: 31.1 pg (ref 26.0–34.0)
MCHC: 34.3 g/dL (ref 30.0–36.0)
MCV: 90.8 fL (ref 80.0–100.0)
Monocytes Absolute: 0.4 10*3/uL (ref 0.1–1.0)
Monocytes Relative: 7 %
Neutro Abs: 3.8 10*3/uL (ref 1.7–7.7)
Neutrophils Relative %: 63 %
Platelets: 329 10*3/uL (ref 150–400)
RBC: 4.79 MIL/uL (ref 3.87–5.11)
RDW: 12.6 % (ref 11.5–15.5)
WBC: 6.1 10*3/uL (ref 4.0–10.5)
nRBC: 0 % (ref 0.0–0.2)

## 2019-09-22 LAB — BASIC METABOLIC PANEL
Anion gap: 13 (ref 5–15)
BUN: 10 mg/dL (ref 6–20)
CO2: 23 mmol/L (ref 22–32)
Calcium: 9.7 mg/dL (ref 8.9–10.3)
Chloride: 100 mmol/L (ref 98–111)
Creatinine, Ser: 0.78 mg/dL (ref 0.44–1.00)
GFR calc Af Amer: >60 mL/min (ref >60)
GFR calc non Af Amer: >60 mL/min (ref >60)
Glucose, Bld: 93 mg/dL (ref 70–99)
Potassium: 2.9 mmol/L — ABNORMAL LOW (ref 3.5–5.1)
Sodium: 136 mmol/L (ref 135–145)

## 2019-09-22 LAB — PREGNANCY, URINE: Preg Test, Ur: NEGATIVE

## 2019-09-22 LAB — D-DIMER, QUANTITATIVE: D-Dimer, Quant: <0.27 ug/mL-FEU (ref 0.00–0.50)

## 2019-09-22 LAB — TROPONIN I (HIGH SENSITIVITY)
Troponin I (High Sensitivity): <2 ng/L (ref <18)
Troponin I (High Sensitivity): <2 ng/L (ref <18)

## 2019-09-22 MED ORDER — POTASSIUM CHLORIDE CRYS ER 20 MEQ PO TBCR
40.0000 meq | EXTENDED_RELEASE_TABLET | Freq: Once | ORAL | Status: AC
  Administered 2019-09-22: 40 meq via ORAL
  Filled 2019-09-22: qty 2

## 2019-09-22 MED ORDER — THIAMINE HCL 100 MG/ML IJ SOLN
100.0000 mg | Freq: Every day | INTRAMUSCULAR | Status: DC
  Administered 2019-09-22: 100 mg via INTRAVENOUS
  Filled 2019-09-22: qty 2

## 2019-09-22 MED ORDER — POTASSIUM CHLORIDE CRYS ER 20 MEQ PO TBCR: 20.0000 meq | EXTENDED_RELEASE_TABLET | Freq: Two times a day (BID) | ORAL | 0 refills | Status: DC

## 2019-09-22 MED ORDER — LORAZEPAM 1 MG PO TABS: 0.0000 mg | ORAL_TABLET | Freq: Two times a day (BID) | ORAL | Status: DC

## 2019-09-22 MED ORDER — LORAZEPAM 1 MG PO TABS: 0.0000 mg | ORAL_TABLET | Freq: Four times a day (QID) | ORAL | Status: DC

## 2019-09-22 MED ORDER — HYDROXYZINE HCL 25 MG PO TABS: 25.0000 mg | ORAL_TABLET | Freq: Four times a day (QID) | ORAL | 0 refills | Status: DC

## 2019-09-22 MED ORDER — THIAMINE HCL 100 MG PO TABS: 100.0000 mg | ORAL_TABLET | Freq: Every day | ORAL | Status: DC

## 2019-09-22 MED ORDER — LORAZEPAM 2 MG/ML IJ SOLN
1.0000 mg | Freq: Once | INTRAMUSCULAR | Status: AC
  Administered 2019-09-22: 1 mg via INTRAVENOUS
  Filled 2019-09-22: qty 1

## 2019-09-22 MED ORDER — KETOROLAC TROMETHAMINE 30 MG/ML IJ SOLN
15.0000 mg | Freq: Once | INTRAMUSCULAR | Status: AC
  Administered 2019-09-22: 15 mg via INTRAVENOUS
  Filled 2019-09-22: qty 1

## 2019-09-22 MED ORDER — SODIUM CHLORIDE 0.9 % IV BOLUS
1000.0000 mL | Freq: Once | INTRAVENOUS | Status: AC
  Administered 2019-09-22: 1000 mL via INTRAVENOUS

## 2019-09-22 MED ORDER — HYDROXYZINE HCL 25 MG PO TABS
25.0000 mg | ORAL_TABLET | Freq: Once | ORAL | Status: AC
  Administered 2019-09-22: 25 mg via ORAL
  Filled 2019-09-22: qty 1

## 2019-09-22 MED ORDER — LORAZEPAM 2 MG/ML IJ SOLN: 0.0000 mg | Freq: Two times a day (BID) | INTRAMUSCULAR | Status: DC

## 2019-09-22 MED ORDER — ONDANSETRON HCL 4 MG/2ML IJ SOLN
4.0000 mg | Freq: Once | INTRAMUSCULAR | Status: AC
  Administered 2019-09-22: 4 mg via INTRAVENOUS
  Filled 2019-09-22: qty 2

## 2019-09-22 MED ORDER — LORAZEPAM 2 MG/ML IJ SOLN: 0.0000 mg | Freq: Four times a day (QID) | INTRAMUSCULAR | Status: DC

## 2019-09-22 NOTE — ED Notes (Signed)
Pt. 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  Pt. Said she is going to see a Cardiologist due to he past use of drugs and poss. Cardiac damage.

## 2019-09-22 NOTE — Discharge Instructions (Addendum)
  Your potassium was lower than normal.  Take the potassium supplement and have this value rechecked by a primary care provider in the next week or two. Your work-up was otherwise reassuring. Follow-up with cardiology on this matter.

## 2019-09-22 NOTE — ED Triage Notes (Signed)
States like she can't catch her breath. She gets super dizzy and may have passed out. She has been watching her heart rate on her watch and it is irregular. Her arms get numb during these episodes. Hx of anxiety. She just got of rehab a week ago. She had these same "spells" while in rehab. She is ambulatory. No distress. EKG done at triage.

## 2019-09-22 NOTE — ED Provider Notes (Signed)
[facility] EMERGENCY DEPARTMENT Provider Note   CSN: [id number] Arrival date & time: 09/22/19  1435     History Chief Complaint  Patient presents with  . Chest Pain    Rachael Pope is a 29 y.o. female.  HPI      Rachael Pope is a 29 y.o. female, with a history of anxiety, cocaine use, alcohol use, presenting to the ED with chest discomfort intermittent today.  Pain became constant about 2 hours prior to arrival, left chest, sharp/stabbing, currently 7/10, worse with deep breathing.  Intermittent feeling of lightheadedness and nausea.  She has had similar episodes for the last couple months. She has been under a great deal of stress lately due to personal issues in her life. 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  She began reusing both 2 days ago with last use about 24 hours ago. Due to her previous episodes of a similar nature, patient has an appointment with a cardiologist scheduled for April 1. Denies fever/chills, cough, abdominal pain, back pain, focal neurologic deficits, trauma, lower extremity swelling/pain, or any other complaints.    Past Medical History:  Diagnosis Date  . Anxiety     There are no problems to display for this patient.   Past Surgical History:  Procedure Laterality Date  . TUBAL LIGATION       OB History    Gravida  1   Para      Term      Preterm      AB      Living        SAB      TAB      Ectopic      Multiple      Live Births              No family history on file.  Social History   Tobacco Use  . Smoking status: Former Games developer  . Smokeless tobacco: Never Used  Substance Use Topics  . Alcohol use: Yes  . Drug use: Yes    Types: Cocaine    Home Medications Prior to Admission medications   Medication Sig Start Date End Date Taking? Authorizing Provider  buPROPion HCl (WELLBUTRIN XL PO) Take by mouth.   Yes [provider]    hydrOXYzine (ATARAX/VISTARIL) 25 MG tablet Take 1 tablet (25 mg total) by mouth every 6 (six) hours. 09/22/19   Fraya Ueda C, PA-C  ibuprofen (ADVIL,MOTRIN) 800 MG tablet Take 1 tablet (800 mg total) by mouth every 8 (eight) hours as needed. 05/04/16   Mesner, Barbara Cower, MD  polyethylene glycol powder (MIRALAX) powder Take 17 g by mouth daily. 11/05/15   Palumbo, April, MD  potassium chloride SA (KLOR-CON) 20 MEQ tablet Take 1 tablet (20 mEq total) by mouth 2 (two) times daily for 7 days. 09/22/19 09/29/19  Kani Chauvin, Hillard Danker, PA-C  Prenatal Vit-Fe Fumarate-FA (PRENATAL COMPLETE) 14-0.4 MG TABS Take 1 tablet by mouth 1 day or 1 dose. 02/13/11   Elson Areas, PA-C    Allergies    Patient has no known allergies.  Review of Systems   Review of Systems  Constitutional: Negative for chills, diaphoresis and fever.  Respiratory: Positive for shortness of breath. Negative for cough.   Cardiovascular: Positive for chest pain. Negative for leg swelling.  Gastrointestinal: Positive for nausea. Negative for abdominal pain, blood in stool, diarrhea and vomiting.  Neurological: Positive for light-headedness. Negative  for weakness, numbness and headaches.  All other systems reviewed and are negative.   Physical Exam Updated Vital Signs BP (!) 142/92   Pulse (!) 103   Temp 98.1 F (36.7 C) (Oral)   Resp 20   Ht 5\' 10"  (1.778 m)   Wt 71.7 kg   LMP 09/15/2019   SpO2 100%   BMI 22.67 kg/m   Physical Exam Vitals and nursing note reviewed.  Constitutional:      General: She is not in acute distress.    Appearance: She is well-developed. She is not diaphoretic.  HENT:     Head: Normocephalic and atraumatic.     Mouth/Throat:     Mouth: Mucous membranes are moist.     Pharynx: Oropharynx is clear.  Eyes:     Conjunctiva/sclera: Conjunctivae normal.  Cardiovascular:     Rate and Rhythm: Regular rhythm. Tachycardia present.     Pulses: Normal pulses.          Radial pulses are 2+ on the right side and 2+  on the left side.       Posterior tibial pulses are 2+ on the right side and 2+ on the left side.     Heart sounds: Normal heart sounds.     Comments: Tactile temperature in the extremities appropriate and equal bilaterally. When I initially began listening to the patient's heart, it seemed to be a normal rate, however, as I kept listening it sped up into a mild tachycardia. Pulmonary:     Effort: Pulmonary effort is normal. No respiratory distress.     Breath sounds: Normal breath sounds.  Abdominal:     Palpations: Abdomen is soft.     Tenderness: There is no abdominal tenderness. There is no guarding.  Musculoskeletal:     Cervical back: Neck supple.     Right lower leg: No edema.     Left lower leg: No edema.  Lymphadenopathy:     Cervical: No cervical adenopathy.  Skin:    General: Skin is warm and dry.  Neurological:     Mental Status: She is alert.  Psychiatric:        Mood and Affect: Mood and affect normal.        Speech: Speech normal.        Behavior: Behavior normal.     ED Results / Procedures / Treatments   Labs (all labs ordered are listed, but only abnormal results are displayed) Labs Reviewed  BASIC METABOLIC PANEL - Abnormal; Notable for the following components:      Result Value   Potassium 2.9 (*)    All other components within normal limits  CBC WITH DIFFERENTIAL/PLATELET  PREGNANCY, URINE  D-DIMER, QUANTITATIVE (NOT AT [facility])  TROPONIN I (HIGH SENSITIVITY)  TROPONIN I (HIGH SENSITIVITY)    EKG EKG Interpretation  Date/Time:  Monday September 22 2019 14:51:08 EST Ventricular Rate:  103 PR Interval:  116 QRS Duration: 92 QT Interval:  346 QTC Calculation: 453 R Axis:   -42 Text Interpretation: Sinus tachycardia Left axis deviation Abnormal ECG nonspecific T waves. Confirmed by Sherwood Gambler [phone]) on 09/22/2019 2:57:36 PM   Radiology DG Chest 2 View  Result Date: 09/22/2019 CLINICAL DATA:  Chest pain and shortness of breath EXAM: CHEST - 2  VIEW COMPARISON:  April 15, 2018 FINDINGS: Lungs are clear. Heart size and pulmonary vascularity are normal. No adenopathy. No pneumothorax. No bone lesions. IMPRESSION: No abnormality noted. Electronically Signed   By: Lowella Grip III  M.D.   On: 09/22/2019 15:20    Procedures Procedures (including critical care time)  Medications Ordered in ED Medications  ketorolac (TORADOL) 30 MG/ML injection 15 mg (15 mg Intravenous Given 09/22/19 1644)  hydrOXYzine (ATARAX/VISTARIL) tablet 25 mg (25 mg Oral Given 09/22/19 1646)  sodium chloride 0.9 % bolus 1,000 mL (0 mLs Intravenous Stopped 09/22/19 1758)  ondansetron (ZOFRAN) injection 4 mg (4 mg Intravenous Given 09/22/19 1645)  potassium chloride SA (KLOR-CON) CR tablet 40 mEq (40 mEq Oral Given 09/22/19 1646)  LORazepam (ATIVAN) injection 1 mg (1 mg Intravenous Given 09/22/19 1804)    ED Course  I have reviewed the triage vital signs and the nursing notes.  Pertinent labs & imaging results that were available during my care of the patient were reviewed by me and considered in my medical decision making (see chart for details).  Clinical Course as of Sep 22 1417  Mon Sep 22, 2019  1600 Noted to be 2.9 one year ago as well.  Potassium(!): 2.9 [SJ]    Clinical Course User Index [SJ] Serenity Batley, Hillard Danker, PA-C   MDM Rules/Calculators/A&P                      Patient presents with chest discomfort. Patient is nontoxic appearing, afebrile, not tachypneic, not hypotensive, maintains excellent SPO2 on room air, and is in no apparent distress.  Intermittent tachycardia, though when she is tachycardic it seems to be accompanied by anxiety.  I reviewed and interpreted the patient's labs and radiological studies. Hypokalemia noted and addressed. Low suspicion for ACS. HEART score is 1, indicating low risk for a cardiac event.  EKG without evidence of acute ischemia or pathologic/symptomatic arrhythmia, though there were nonspecific T wave changes.  Delta  troponins negative. Wells criteria score is 0, indicating low risk for PE.  D-dimer negative. Dissection was considered, but thought less likely base on: History and description of the pain are not suggestive, patient is not ill-appearing, lack of risk factors, equal bilateral pulses, lack of neurologic deficits, no widened mediastinum on chest x-ray. Patient has appropriate follow-up already scheduled with cardiology, however, we advised her to contact our cardiology group to see if they can see her sooner.  Findings and plan of care discussed with Pricilla Loveless, MD. Dr. Criss Alvine personally evaluated and examined this patient.   Final Clinical Impression(s) / ED Diagnoses Final diagnoses:  Atypical chest pain  Hypokalemia    Rx / DC Orders ED Discharge Orders         Ordered    hydrOXYzine (ATARAX/VISTARIL) 25 MG tablet  Every 6 hours     09/22/19 2018    potassium chloride SA (KLOR-CON) 20 MEQ tablet  2 times daily     09/22/19 2020           Anselm Pancoast, PA-C 09/23/19 1419    Pricilla Loveless, MD 09/25/19 1658

## 2019-09-25 NOTE — Progress Notes (Signed)
Cardiology Office Note:   Date:  09/26/2019  NAME:  Rachael Pope    MRN: [id number] DOB:  05-30-1991   PCP:  Patient, No Pcp Per  Cardiologist:  No primary care provider on file.   Referring MD: Rachael Gambler, MD   Chief Complaint  Patient presents with   Chest Pain    History of Present Illness:   Rachael Pope is a 29 y.o. female with a hx of anxiety, substance abuse who is being seen today for the evaluation of chest pain at the request of Rachael Gambler, MD.  She presents as a follow-up from the emergency room.  She was seen on Monday of this week with an episode of elevated heart rate as well as left-sided chest pain.  She reports that she felt she was in a pass out.  She reports that her heart rate would not go down and she was taken to the emergency room by her landlord.  She does report that she used cocaine the night before.  Apparently, she has used much more the past and this was a bit lower on the dose scale for her and would not normally cause such profound symptoms.  She reports the chest pain as a left-sided sharp pain.  She apparently has been bothered by increases in her heart rate for few weeks.  She reports at times her heart rate can go up into the 140-150 range upon standing.  She reports no alcohol consumption.  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  She did go to rehab in January.  Her labs in the emergency room were notable for a potassium of 2.9.  She has had low potassium on recent BMP values.  Her EKG today demonstrates an incomplete right bundle branch block with left axis deviation.  It is similar to a tracing from September 2019.  Her high-sensitivity troponins were negative x2.  She does not report excess caffeine consumption.  She does not use energy drinks such as Monster or red bull.  She has not had a thyroid study checked in the system.  She is currently not pregnant and has had a tubal ligation.  She does have 3 children.  There is  no known family history as she is adopted.  She does report a history of anxiety but reports she is not stressed more than usual.  There is no significant depression reported either.  She does not currently smoke cigarettes but does use e-cigarettes.  Past Medical History: Past Medical History:  Diagnosis Date   Anxiety     Past Surgical History: Past Surgical History:  Procedure Laterality Date   TUBAL LIGATION      Current Medications: Current Meds  Medication Sig   buPROPion HCl (WELLBUTRIN XL PO) Take by mouth.   hydrOXYzine (ATARAX/VISTARIL) 25 MG tablet Take 1 tablet (25 mg total) by mouth every 6 (six) hours.   ibuprofen (ADVIL,MOTRIN) 800 MG tablet Take 1 tablet (800 mg total) by mouth every 8 (eight) hours as needed.   potassium chloride SA (KLOR-CON) 20 MEQ tablet Take 1 tablet (20 mEq total) by mouth 2 (two) times daily for 7 days.     Allergies:    Bee venom   Social History: Social History   Socioeconomic History   Marital status: Legally Separated    Spouse name: Not on file   Number of children: 3   Years of education: Not on file   Highest education level: Not on  file  Occupational History   Occupation: waitress  Tobacco Use   Smoking status: Former Smoker    Years: 2.00    Types: E-cigarettes   Smokeless tobacco: Never Used  Substance and Sexual Activity   Alcohol use: Not Currently   Drug use: Yes    Types: Cocaine   Sexual activity: Not on file  Other Topics Concern   Not on file  Social History Narrative   Not on file   Social Determinants of Health   Financial Resource Strain:    Difficulty of Paying Living Expenses:   Food Insecurity:    Worried About Programme researcher, broadcasting/film/video in the Last Year:    Barista in the Last Year:   Transportation Needs:    Freight forwarder (Medical):    Lack of Transportation (Non-Medical):   Physical Activity:    Days of Exercise per Week:    Minutes of Exercise per  Session:   Stress:    Feeling of Stress :   Social Connections:    Frequency of Communication with Friends and Family:    Frequency of Social Gatherings with Friends and Family:    Attends Religious Services:    Active Member of Clubs or Organizations:    Attends Banker Meetings:    Marital Status:      Family History: The patient's family history is not on file. She was adopted.  ROS:   All other ROS reviewed and negative. Pertinent positives noted in the HPI.     EKGs/Labs/Other Studies Reviewed:   The following studies were personally reviewed by me today:  EKG:  EKG is ordered today.  The ekg ordered today demonstrates normal sinus rhythm, heart rate 76, incomplete right bundle branch block, left axis deviation noted, and was personally reviewed by me.   Recent Labs: 09/22/2019: BUN 10; Creatinine, Ser 0.78; Hemoglobin 14.9; Platelets 329; Potassium 2.9; Sodium 136   Recent Lipid Panel No results found for: CHOL, TRIG, HDL, CHOLHDL, VLDL, LDLCALC, LDLDIRECT  Physical Exam:   VS:  BP 126/84    Pulse 76    Ht 5\' 10"  (1.778 m)    Wt 160 lb 6.4 oz (72.8 kg)    LMP 09/15/2019    SpO2 99%    BMI 23.02 kg/m    Wt Readings from Last 3 Encounters:  09/26/19 160 lb 6.4 oz (72.8 kg)  09/22/19 158 lb (71.7 kg)  04/15/18 155 lb (70.3 kg)    General: Well nourished, well developed, in no acute distress Heart: Atraumatic, normal size  Eyes: PEERLA, EOMI  Neck: Supple, no JVD Endocrine: No thryomegaly Cardiac: Normal S1, S2; RRR; no murmurs, rubs, or gallops Lungs: Clear to auscultation bilaterally, no wheezing, rhonchi or rales  Abd: Soft, nontender, no hepatomegaly  Ext: No edema, pulses 2+ Musculoskeletal: No deformities, BUE and BLE strength normal and equal Skin: Warm and dry, no rashes   Neuro: Alert and oriented to person, place, time, and situation, CNII-XII grossly intact, no focal deficits  Psych: Normal mood and affect   ASSESSMENT:   Rachael  Pope is a 29 y.o. female who presents for the following: 1. Chest pain of uncertain etiology   2. Nonspecific abnormal electrocardiogram (ECG) (EKG)   3. Palpitations     PLAN:   1. Chest pain of uncertain etiology 2. Nonspecific abnormal electrocardiogram (ECG) (EKG) 3. Palpitations -I suspect her chest pain is related to using cocaine.  Her EKG does demonstrate an incomplete right  bundle branch block with left axis deviation.  She ruled out for an acute coronary syndrome in the emergency room.  Given her age to have a low suspicion for obstructive CAD.  We will go ahead and obtain an echocardiogram just to ensure heart structurally normal.  I see no utility in an ischemia evaluation given her young age.  She will refrain from using cocaine. -Her palpitations could also be related to cocaine intoxication.  She also could be having episodes of arrhythmia.  She does report that she has had several weeks of her heart racing at times.  I think it will be prudent to check a TSH to exclude any thyroid disease here.  We also will proceed with a 7-day Zio patch to exclude any significant arrhythmia.  Again she will refrain from any substance use.  I have also advised her to to stop using e-cigarettes. -Her potassium was low in the emergency room.  This is really unclear to me.  She reports no other symptoms that would explain this.  I will plan to repeat a BMP when I see her back.   Disposition: Return in about 3 months (around 12/27/2019).  Medication Adjustments/Labs and Tests Ordered: Current medicines are reviewed at length with the patient today.  Concerns regarding medicines are outlined above.  Orders Placed This Encounter  Procedures   TSH   LONG TERM MONITOR (3-14 DAYS)   EKG 12-Lead   ECHOCARDIOGRAM COMPLETE   No orders of the defined types were placed in this encounter.   Patient Instructions  Medication Instructions:  The current medical regimen is effective;  continue  present plan and medications.  *If you need a refill on your cardiac medications before your next appointment, please call your pharmacy*   Lab Work: TSH today  If you have labs (blood work) drawn today and your tests are completely normal, you will receive your results only by:  MyChart Message (if you have MyChart) OR  A paper copy in the mail If you have any lab test that is abnormal or we need to change your treatment, we will call you to review the results.   Testing/Procedures:  Echocardiogram - Your physician has requested that you have an echocardiogram. Echocardiography is a painless test that uses sound waves to create images of your heart. It provides your doctor with information about the size and shape of your heart and how well your hearts chambers and valves are working. This procedure takes approximately one hour. There are no restrictions for this procedure. This will be performed at our [facility], [place] location - [address].  Your physician has recommended that you wear a 7 DAY ZIO-PATCH monitor. The Zio patch cardiac monitor continuously records heart rhythm data for up to 14 days, this is for patients being evaluated for multiple types heart rhythms. For the first 24 hours post application, please avoid getting the Zio monitor wet in the shower or by excessive sweating during exercise. After that, feel free to carry on with regular activities. Keep soaps and lotions away from the ZIO XT Patch.  This will be mailed to you, please expect 7-10 days to receive.          Follow-Up: At [facility], you and your health needs are our priority.  As part of our continuing mission to provide you with exceptional heart care, we have created designated Provider Care Teams.  These Care Teams include your primary Cardiologist (physician) and Advanced Practice  Providers (APPs -  Physician Assistants and Nurse Practitioners) who all work together to provide you with the  care you need, when you need it.  We recommend signing up for the patient portal called "MyChart".  Sign up information is provided on this After Visit Summary.  MyChart is used to connect with patients for Virtual Visits (Telemedicine).  Patients are able to view lab/test results, encounter notes, upcoming appointments, etc.  Non-urgent messages can be sent to your provider as well.   To learn more about what you can do with MyChart, go to [email or web address].    Your next appointment:   3 month(s)  The format for your next appointment:   In Person  Provider:   Lennie Odor, MD        Signed, Lenna Gilford. Flora Lipps, MD [facility]  [address], [address] [place], [place] [address] [phone]  09/26/2019 2:45 PM

## 2019-09-26 ENCOUNTER — Telehealth: Payer: Self-pay | Admitting: Radiology

## 2019-09-26 ENCOUNTER — Ambulatory Visit: Payer: Managed Care, Other (non HMO) | Admitting: Cardiovascular Disease

## 2019-09-26 ENCOUNTER — Encounter: Payer: Self-pay | Admitting: Cardiovascular Disease

## 2019-09-26 ENCOUNTER — Other Ambulatory Visit: Payer: Self-pay

## 2019-09-26 VITALS — BP 126/84 | HR 76 | Ht 70.0 in | Wt 160.4 lb

## 2019-09-26 DIAGNOSIS — R002 Palpitations: Secondary | ICD-10-CM

## 2019-09-26 DIAGNOSIS — R079 Chest pain, unspecified: Secondary | ICD-10-CM | POA: Diagnosis not present

## 2019-09-26 DIAGNOSIS — R9431 Abnormal electrocardiogram [ECG] [EKG]: Secondary | ICD-10-CM | POA: Diagnosis not present

## 2019-09-26 NOTE — Telephone Encounter (Signed)
Enrolled patient for a 7 day Zio monitor to be mailed to patients home.  

## 2019-09-26 NOTE — Patient Instructions (Addendum)
Medication Instructions:  The current medical regimen is effective;  continue present plan and medications.  *If you need a refill on your cardiac medications before your next appointment, please call your pharmacy*   Lab Work: TSH today  If you have labs (blood work) drawn today and your tests are completely normal, you will receive your results only by: Marland Kitchen MyChart Message (if you have MyChart) OR . A paper copy in the mail If you have any lab test that is abnormal or we need to change your treatment, we will call you to review the results.   Testing/Procedures:  Echocardiogram - Your physician has requested that you have an echocardiogram. Echocardiography is a painless test that uses sound waves to create images of your heart. It provides your doctor with information about the size and shape of your heart and how well your heart's chambers and valves are working. This procedure takes approximately one hour. There are no restrictions for this procedure. This will be performed at our [facility] location - [address], [address].  Your physician has recommended that you wear a 7 DAY ZIO-PATCH monitor. The Zio patch cardiac monitor continuously records heart rhythm data for up to 14 days, this is for patients being evaluated for multiple types heart rhythms. For the first 24 hours post application, please avoid getting the Zio monitor wet in the shower or by excessive sweating during exercise. After that, feel free to carry on with regular activities. Keep soaps and lotions away from the ZIO XT Patch.  This will be mailed to you, please expect 7-10 days to receive.          Follow-Up: At [facility], you and your health needs are our priority.  As part of our continuing mission to provide you with exceptional heart care, we have created designated Provider Care Teams.  These Care Teams include your primary Cardiologist (physician) and Advanced Practice Providers (APPs -  Physician  Assistants and Nurse Practitioners) who all work together to provide you with the care you need, when you need it.  We recommend signing up for the patient portal called "MyChart".  Sign up information is provided on this After Visit Summary.  MyChart is used to connect with patients for Virtual Visits (Telemedicine).  Patients are able to view lab/test results, encounter notes, upcoming appointments, etc.  Non-urgent messages can be sent to your provider as well.   To learn more about what you can do with MyChart, go to [email or web address].    Your next appointment:   3 month(s)  The format for your next appointment:   In Person  Provider:   Lennie Odor, MD

## 2019-09-27 LAB — TSH: TSH: 1.09 u[IU]/mL (ref 0.450–4.500)

## 2019-10-01 ENCOUNTER — Other Ambulatory Visit (INDEPENDENT_AMBULATORY_CARE_PROVIDER_SITE_OTHER): Payer: Managed Care, Other (non HMO)

## 2019-10-01 DIAGNOSIS — R002 Palpitations: Secondary | ICD-10-CM | POA: Diagnosis not present

## 2019-10-14 ENCOUNTER — Other Ambulatory Visit: Payer: Self-pay

## 2019-10-14 ENCOUNTER — Ambulatory Visit (HOSPITAL_COMMUNITY): Payer: Managed Care, Other (non HMO) | Attending: Cardiology

## 2019-10-14 DIAGNOSIS — R002 Palpitations: Secondary | ICD-10-CM | POA: Diagnosis not present

## 2019-10-14 MED ORDER — PERFLUTREN LIPID MICROSPHERE
1.0000 mL | INTRAVENOUS | Status: AC | PRN
  Administered 2019-10-14: 1 mL via INTRAVENOUS

## 2019-12-31 NOTE — Progress Notes (Deleted)
Cardiology Office Note:   Date:  12/31/2019  NAME:  Rachael Pope    MRN: [id number] DOB:  1990-09-11   PCP:  Patient, No Pcp Per  Cardiologist:  No primary care provider on file.  Electrophysiologist:  None   Referring MD: No ref. provider found   No chief complaint on file. ***  History of Present Illness:   Rachael Pope is a 29 y.o. female with a hx of anxiety and substance abuse who presents for follow-up of chest pain and palpitations. Echo normal. Monitor without significant arrhythmia.   Past Medical History: Past Medical History:  Diagnosis Date  . Anxiety     Past Surgical History: Past Surgical History:  Procedure Laterality Date  . TUBAL LIGATION      Current Medications: No outpatient medications have been marked as taking for the 01/01/20 encounter (Appointment) with O'Neal, Ronnald Ramp, MD.     Allergies:    Bee venom   Social History: Social History   Socioeconomic History  . Marital status: Legally Separated    Spouse name: Not on file  . Number of children: 3  . Years of education: Not on file  . Highest education level: Not on file  Occupational History  . Occupation: waitress  Tobacco Use  . Smoking status: Former Smoker    Years: 2.00    Types: E-cigarettes  . Smokeless tobacco: Never Used  Vaping Use  . Vaping Use: Every day  Substance and Sexual Activity  . Alcohol use: Not Currently  . Drug use: Yes    Types: Cocaine  . Sexual activity: Not on file  Other Topics Concern  . Not on file  Social History Narrative  . Not on file   Social Determinants of Health   Financial Resource Strain:   . Difficulty of Paying Living Expenses:   Food Insecurity:   . Worried About Programme researcher, broadcasting/film/video in the Last Year:   . Barista in the Last Year:   Transportation Needs:   . Freight forwarder (Medical):   Marland Kitchen Lack of Transportation (Non-Medical):   Physical Activity:   . Days of Exercise per Week:   . Minutes of  Exercise per Session:   Stress:   . Feeling of Stress :   Social Connections:   . Frequency of Communication with Friends and Family:   . Frequency of Social Gatherings with Friends and Family:   . Attends Religious Services:   . Active Member of Clubs or Organizations:   . Attends Banker Meetings:   Marland Kitchen Marital Status:      Family History: The patient's ***family history is not on file. She was adopted.  ROS:   All other ROS reviewed and negative. Pertinent positives noted in the HPI.     EKGs/Labs/Other Studies Reviewed:   The following studies were personally reviewed by me today:  EKG:  EKG is *** ordered today.  The ekg ordered today demonstrates ***, and was personally reviewed by me.  TTE 10/14/2019 1. Left ventricular ejection fraction, by estimation, is 55 to 60%. The  left ventricle has normal function. The left ventricle has no regional  wall motion abnormalities. The left ventricular internal cavity size was  moderately dilated. Left ventricular  diastolic parameters were normal.  2. Right ventricular systolic function is normal. The right ventricular  size is mildly enlarged. There is normal pulmonary artery systolic  pressure. The estimated right ventricular systolic pressure is 26.5 mmHg.  3.  The mitral valve is normal in structure. No evidence of mitral valve  regurgitation.  4. The aortic valve is tricuspid. Aortic valve regurgitation is not  visualized. No aortic stenosis is present.  5. The inferior vena cava is normal in size with <50% respiratory  variability, suggesting right atrial pressure of 8 mmHg.   Zio 10/26/2019 Enrollment period 10/01/2019-10/09/2019 (7 days 13 hours). Patient had a min HR of 49 bpm (sinus bradycardia), max HR of 187 bpm (sinus tachycardia), and avg HR of 95 bpm (normal sinus rhythm). Predominant underlying rhythm was Sinus Rhythm. 1 run of Supraventricular Tachycardia occurred lasting 7 beats (2.4 second duration)  with a max rate of 187 bpm (avg 180 bpm). Isolated SVEs were rare (<1.0%), SVE Couplets were rare (<1.0%), and SVE Triplets were rare (<1.0%). Isolated VEs were rare (<1.0%), and no VE Couplets or VE Triplets were present. Diary summarized below:   10/01/19 12:42pm short of breath, fluttering/racing, chest pain/pressure coincided with sinus tachycardia 105 bpm.  10/01/19 12:47pm short of breath, pounding, dizziness, chest pain/pressure coincided with sinus tachycardia 103 bpm.  10/01/19 12:50pm short of breath, chest pain/pressure coincided with sinus tachycardia 107 bpm.   There were 49 triggered events that coincided with normal sinus rhythm 75 bpm to sinus tachycardia 175 bpm.   On 3/17 the max heart rate reached 187 bpm. On review of heart rate trend, this rate increased gradually over several hours and then resolved over several hours, suggestive of sinus tachycardia.   Impression:  1. No significant arrhythmia detected.  2. Symptoms coincided with sinus rhythm/sinus tachycardia.  3. Rare ectopy.        Recent Labs: 09/22/2019: BUN 10; Creatinine, Ser 0.78; Hemoglobin 14.9; Platelets 329; Potassium 2.9; Sodium 136 09/26/2019: TSH 1.090   Recent Lipid Panel No results found for: CHOL, TRIG, HDL, CHOLHDL, VLDL, LDLCALC, LDLDIRECT  Physical Exam:   VS:  There were no vitals taken for this visit.   Wt Readings from Last 3 Encounters:  09/26/19 160 lb 6.4 oz (72.8 kg)  09/22/19 158 lb (71.7 kg)  04/15/18 155 lb (70.3 kg)    General: Well nourished, well developed, in no acute distress Heart: Atraumatic, normal size  Eyes: PEERLA, EOMI  Neck: Supple, no JVD Endocrine: No thryomegaly Cardiac: Normal S1, S2; RRR; no murmurs, rubs, or gallops Lungs: Clear to auscultation bilaterally, no wheezing, rhonchi or rales  Abd: Soft, nontender, no hepatomegaly  Ext: No edema, pulses 2+ Musculoskeletal: No deformities, BUE and BLE strength normal and equal Skin: Warm and dry, no  rashes   Neuro: Alert and oriented to person, place, time, and situation, CNII-XII grossly intact, no focal deficits  Psych: Normal mood and affect   ASSESSMENT:   Rachael Pope is a 29 y.o. female who presents for the following: No diagnosis found.  PLAN:   There are no diagnoses linked to this encounter.  Disposition: No follow-ups on file.  Medication Adjustments/Labs and Tests Ordered: Current medicines are reviewed at length with the patient today.  Concerns regarding medicines are outlined above.  No orders of the defined types were placed in this encounter.  No orders of the defined types were placed in this encounter.   There are no Patient Instructions on file for this visit.   Time Spent with Patient: I have spent a total of *** minutes with patient reviewing hospital notes, telemetry, EKGs, labs and examining the patient as well as establishing an assessment and plan that was discussed with the patient.  >  50% of time was spent in direct patient care.  Signed, Lenna Gilford. Flora Lipps, MD [facility]  [address], [address] [place], [place] [address] [phone]  12/31/2019 7:18 AM

## 2020-01-01 ENCOUNTER — Ambulatory Visit: Payer: Managed Care, Other (non HMO) | Admitting: Cardiovascular Disease

## 2020-04-26 ENCOUNTER — Other Ambulatory Visit: Payer: Self-pay | Admitting: Nurse Practitioner

## 2020-04-26 DIAGNOSIS — R519 Headache, unspecified: Secondary | ICD-10-CM

## 2020-05-16 ENCOUNTER — Ambulatory Visit (INDEPENDENT_AMBULATORY_CARE_PROVIDER_SITE_OTHER): Payer: Managed Care, Other (non HMO)

## 2020-05-16 ENCOUNTER — Other Ambulatory Visit: Payer: Self-pay

## 2020-05-16 DIAGNOSIS — R519 Headache, unspecified: Secondary | ICD-10-CM

## 2020-05-31 ENCOUNTER — Emergency Department (HOSPITAL_BASED_OUTPATIENT_CLINIC_OR_DEPARTMENT_OTHER): Payer: Managed Care, Other (non HMO)

## 2020-05-31 ENCOUNTER — Emergency Department (HOSPITAL_BASED_OUTPATIENT_CLINIC_OR_DEPARTMENT_OTHER)
Admission: EM | Admit: 2020-05-31 | Discharge: 2020-05-31 | Disposition: A | Discharge status: Home / Self Care | Payer: Managed Care, Other (non HMO) | Attending: Emergency Medicine | Admitting: Emergency Medicine

## 2020-05-31 ENCOUNTER — Encounter (HOSPITAL_BASED_OUTPATIENT_CLINIC_OR_DEPARTMENT_OTHER): Payer: Self-pay | Admitting: *Deleted

## 2020-05-31 ENCOUNTER — Other Ambulatory Visit: Payer: Self-pay

## 2020-05-31 DIAGNOSIS — R059 Cough, unspecified: Secondary | ICD-10-CM | POA: Diagnosis not present

## 2020-05-31 DIAGNOSIS — J3489 Other specified disorders of nose and nasal sinuses: Secondary | ICD-10-CM | POA: Diagnosis not present

## 2020-05-31 DIAGNOSIS — Z87891 Personal history of nicotine dependence: Secondary | ICD-10-CM | POA: Diagnosis not present

## 2020-05-31 MED ORDER — FLUTICASONE PROPIONATE 50 MCG/ACT NA SUSP: 2.0000 | Freq: Every day | NASAL | 2 refills | Status: DC

## 2020-05-31 MED ORDER — BENZONATATE 100 MG PO CAPS: 100.0000 mg | ORAL_CAPSULE | Freq: Three times a day (TID) | ORAL | 0 refills | Status: DC

## 2020-05-31 MED ORDER — ALBUTEROL SULFATE HFA 108 (90 BASE) MCG/ACT IN AERS: 1.0000 | INHALATION_SPRAY | Freq: Four times a day (QID) | RESPIRATORY_TRACT | 0 refills | Status: AC | PRN

## 2020-05-31 MED ORDER — PREDNISONE 10 MG (21) PO TBPK: ORAL_TABLET | ORAL | 0 refills | Status: DC

## 2020-05-31 NOTE — ED Triage Notes (Signed)
Cough since yesterday. She had a negative Covid test today.

## 2020-05-31 NOTE — ED Provider Notes (Signed)
[facility] EMERGENCY DEPARTMENT Provider Note   CSN: [id number] Arrival date & time: 05/31/20  1135     History Chief Complaint  Patient presents with  . Cough    Rachael Pope is a 29 y.o. female.  HPI      Rachael Pope is a 29 y.o. female, with a history of anxiety, presenting to the ED with nonproductive cough beginning yesterday.  Intermittent sensation of subjective fever as well as rhinorrhea. Intermittent chest tightness, especially with coughing. Patient states she primarily is interested in a chest x-ray.  She had a negative test for Covid and influenza earlier today.  Denies shortness of breath, other chest discomfort, N/V/D, abdominal pain, or any other complaints.  Past Medical History:  Diagnosis Date  . Anxiety     There are no problems to display for this patient.   Past Surgical History:  Procedure Laterality Date  . TUBAL LIGATION       OB History    Gravida  1   Para      Term      Preterm      AB      Living        SAB      TAB      Ectopic      Multiple      Live Births              Family History  Adopted: Yes    Social History   Tobacco Use  . Smoking status: Former Smoker    Years: 2.00    Types: E-cigarettes  . Smokeless tobacco: Never Used  Vaping Use  . Vaping Use: Every day  Substance Use Topics  . Alcohol use: Not Currently  . Drug use: Yes    Types: Cocaine    Home Medications Prior to Admission medications   Medication Sig Start Date End Date Taking? Authorizing Provider  albuterol (VENTOLIN HFA) 108 (90 Base) MCG/ACT inhaler Inhale 1-2 puffs into the lungs every 6 (six) hours as needed for wheezing or shortness of breath. 05/31/20   Ellana Kawa C, PA-C  benzonatate (TESSALON) 100 MG capsule Take 1 capsule (100 mg total) by mouth every 8 (eight) hours. 05/31/20   Petronella Shuford C, PA-C  buPROPion HCl (WELLBUTRIN XL PO) Take by mouth.    [provider]    fluticasone (FLONASE) 50 MCG/ACT nasal spray Place 2 sprays into both nostrils daily. 05/31/20   Kysa Calais C, PA-C  hydrOXYzine (ATARAX/VISTARIL) 25 MG tablet Take 1 tablet (25 mg total) by mouth every 6 (six) hours. 09/22/19   Maximiano Lott C, PA-C  ibuprofen (ADVIL,MOTRIN) 800 MG tablet Take 1 tablet (800 mg total) by mouth every 8 (eight) hours as needed. 05/04/16   Mesner, Barbara Cower, MD  potassium chloride SA (KLOR-CON) 20 MEQ tablet Take 1 tablet (20 mEq total) by mouth 2 (two) times daily for 7 days. 09/22/19 09/29/19  Rina Adney C, PA-C  predniSONE (STERAPRED UNI-PAK 21 TAB) 10 MG (21) TBPK tablet Take 6 tabs (60mg ) day 1, 5 tabs (50mg ) day 2, 4 tabs (40mg ) day 3, 3 tabs (30mg ) day 4, 2 tabs (20mg ) day 5, and 1 tab (10mg ) day 6. 05/31/20   Zoraya Fiorenza C, PA-C    Allergies    Bee venom  Review of Systems   Review of Systems  Constitutional: Positive for fever (subjective). Negative for diaphoresis.  HENT: Positive for rhinorrhea.   Respiratory: Positive for cough and  chest tightness. Negative for shortness of breath.   Gastrointestinal: Negative for abdominal pain, diarrhea, nausea and vomiting.  Neurological: Negative for dizziness and weakness.  All other systems reviewed and are negative.   Physical Exam Updated Vital Signs BP 120/79 (BP Location: Right Arm)   Pulse 83   Temp 98.8 F (37.1 C) (Oral) Comment: pt took ibuprofen 800 mg at 1130  Resp 20   Ht 5\' 10"  (1.778 m)   Wt 77 kg   LMP 05/07/2020   SpO2 98%   BMI 24.36 kg/m   Physical Exam Vitals and nursing note reviewed.  Constitutional:      General: She is not in acute distress.    Appearance: She is well-developed. She is not diaphoretic.  HENT:     Head: Normocephalic and atraumatic.     Mouth/Throat:     Mouth: Mucous membranes are moist.     Pharynx: Oropharynx is clear. No oropharyngeal exudate or posterior oropharyngeal erythema.     Comments:  No noted area of intraoral swelling or fluctuance.  No trismus or  noted abnormal phonation.  Mouth opening to at least 3 finger widths.  Handles oral secretions without difficulty.  No noted facial swelling.  No sublingual swelling or tongue elevation.  No swelling or tenderness to the submental or submandibular regions.  No swelling or tenderness into the soft tissues of the neck. Eyes:     Conjunctiva/sclera: Conjunctivae normal.  Cardiovascular:     Rate and Rhythm: Normal rate and regular rhythm.     Pulses: Normal pulses.     Heart sounds: Normal heart sounds.  Pulmonary:     Effort: Pulmonary effort is normal. No respiratory distress.     Breath sounds: Normal breath sounds.     Comments: No increased work of breathing.  Speaks in full sentences without difficulty. Abdominal:     Palpations: Abdomen is soft.     Tenderness: There is no abdominal tenderness. There is no guarding.  Musculoskeletal:     Cervical back: Neck supple.  Lymphadenopathy:     Cervical: No cervical adenopathy.  Skin:    General: Skin is warm and dry.  Neurological:     Mental Status: She is alert.  Psychiatric:        Mood and Affect: Mood and affect normal.        Speech: Speech normal.        Behavior: Behavior normal.     ED Results / Procedures / Treatments   Labs (all labs ordered are listed, but only abnormal results are displayed) Labs Reviewed - No data to display  EKG None  Radiology DG Chest Portable 1 View  Result Date: 05/31/2020 CLINICAL DATA:  Cough since yesterday with negative COVID-19 test today. EXAM: PORTABLE CHEST 1 VIEW COMPARISON:  10/12/2019 FINDINGS: Lungs are adequately inflated and otherwise clear. Cardiomediastinal silhouette and remainder of the exam is unchanged. IMPRESSION: No active disease. Electronically Signed   By: 10/14/2019 M.D.   On: 05/31/2020 12:10    Procedures Procedures (including critical care time)  Medications Ordered in ED Medications - No data to display  ED Course  I have reviewed the triage vital  signs and the nursing notes.  Pertinent labs & imaging results that were available during my care of the patient were reviewed by me and considered in my medical decision making (see chart for details).    MDM Rules/Calculators/A&P  Patient presents with cough. Patient is nontoxic appearing, afebrile, not tachycardic, not tachypneic, not hypotensive, maintains excellent SPO2 on room air, and is in no apparent distress.   I have reviewed the patient's chart to obtain more information.   I reviewed and interpreted the patient's radiological studies. No acute abnormality on chest x-ray. The patient was given instructions for home care as well as return precautions. Patient voices understanding of these instructions, accepts the plan, and is comfortable with discharge.  Vitals:   05/31/20 1143 05/31/20 1147 05/31/20 1310  BP:  124/83 120/79  Pulse:  99 83  Resp:  20 20  Temp:  98.6 F (37 C) 98.8 F (37.1 C)  TempSrc:  Oral Oral  SpO2:  98% 98%  Weight: 77 kg    Height: 5\' 10"  (1.778 m)      Final Clinical Impression(s) / ED Diagnoses Final diagnoses:  Cough    Rx / DC Orders ED Discharge Orders         Ordered    benzonatate (TESSALON) 100 MG capsule  Every 8 hours        05/31/20 1522    albuterol (VENTOLIN HFA) 108 (90 Base) MCG/ACT inhaler  Every 6 hours PRN        05/31/20 1522    predniSONE (STERAPRED UNI-PAK 21 TAB) 10 MG (21) TBPK tablet        05/31/20 1522    fluticasone (FLONASE) 50 MCG/ACT nasal spray  Daily        05/31/20 1522           06/02/20, PA-C 05/31/20 1527    06/02/20, MD 06/01/20 [phone]

## 2021-05-26 ENCOUNTER — Encounter (HOSPITAL_BASED_OUTPATIENT_CLINIC_OR_DEPARTMENT_OTHER): Payer: Self-pay | Admitting: *Deleted

## 2021-05-26 ENCOUNTER — Emergency Department (HOSPITAL_BASED_OUTPATIENT_CLINIC_OR_DEPARTMENT_OTHER): Payer: Managed Care, Other (non HMO)

## 2021-05-26 ENCOUNTER — Other Ambulatory Visit: Payer: Self-pay

## 2021-05-26 ENCOUNTER — Emergency Department (HOSPITAL_BASED_OUTPATIENT_CLINIC_OR_DEPARTMENT_OTHER)
Admission: EM | Admit: 2021-05-26 | Discharge: 2021-05-26 | Disposition: A | Discharge status: Home / Self Care | Payer: Managed Care, Other (non HMO) | Attending: Emergency Medicine | Admitting: Emergency Medicine

## 2021-05-26 DIAGNOSIS — R234 Changes in skin texture: Secondary | ICD-10-CM | POA: Insufficient documentation

## 2021-05-26 DIAGNOSIS — Z87891 Personal history of nicotine dependence: Secondary | ICD-10-CM | POA: Insufficient documentation

## 2021-05-26 DIAGNOSIS — W268XXA Contact with other sharp object(s), not elsewhere classified, initial encounter: Secondary | ICD-10-CM | POA: Diagnosis not present

## 2021-05-26 DIAGNOSIS — S6992XA Unspecified injury of left wrist, hand and finger(s), initial encounter: Secondary | ICD-10-CM | POA: Insufficient documentation

## 2021-05-26 MED ORDER — LIDOCAINE-EPINEPHRINE (PF) 2 %-1:200000 IJ SOLN
10.0000 mL | Freq: Once | INTRAMUSCULAR | Status: AC
  Administered 2021-05-26: 10 mL via INTRADERMAL
  Filled 2021-05-26: qty 20

## 2021-05-26 NOTE — ED Triage Notes (Signed)
Pt broke a window with her left hand.  Laceration, swelling and bruising to thumb.  Concern for embedded glass.

## 2021-05-26 NOTE — Discharge Instructions (Signed)
REturn for redness, drainage, fever

## 2021-05-26 NOTE — ED Provider Notes (Signed)
[facility] Provider Note   CSN: [id number] Arrival date & time: 05/26/21  2005     History Chief Complaint  Patient presents with   Hand Injury    Rachael Pope is a 30 y.o. female.  30 yo F with a chief complaints of a laceration to the hand.  The patient states that she got angry earlier and smashed her hand into a window.  She suffered a laceration to the palm of her hand.  This was a couple hours ago.  Thinks her tetanus is up-to-date.  The history is provided by the patient.  Hand Injury Location:  Hand Hand location:  L palm Injury: yes   Time since incident:  2 hours Mechanism of injury comment:  Glass Pain details:    Quality:  Aching   Radiates to:  Does not radiate   Severity:  Moderate   Onset quality:  Gradual   Duration:  2 days   Timing:  Constant   Progression:  Worsening Handedness:  Left-handed Dislocation: no   Prior injury to area:  No Relieved by:  Nothing Worsened by:  Nothing Associated symptoms: no fever       Past Medical History:  Diagnosis Date   Anxiety     There are no problems to display for this patient.   Past Surgical History:  Procedure Laterality Date   ABDOMINAL HYSTERECTOMY     TUBAL LIGATION       OB History     Gravida  1   Para      Term      Preterm      AB      Living         SAB      IAB      Ectopic      Multiple      Live Births              Family History  Adopted: Yes    Social History   Tobacco Use   Smoking status: Former    Types: E-cigarettes   Smokeless tobacco: Never  Vaping Use   Vaping Use: Every day  Substance Use Topics   Alcohol use: Not Currently   Drug use: Yes    Types: Cocaine    Comment: last use march 2021    Home Medications Prior to Admission medications   Medication Sig Start Date End Date Taking? Authorizing Provider  atomoxetine (STRATTERA) 40 MG capsule Take 40 mg by mouth daily.   Yes [provider]  paliperidone (INVEGA) 6 MG 24 hr tablet Take 6 mg by mouth daily.   Yes [provider]  albuterol (VENTOLIN HFA) 108 (90 Base) MCG/ACT inhaler Inhale 1-2 puffs into the lungs every 6 (six) hours as needed for wheezing or shortness of breath. 05/31/20   Joy, Shawn C, PA-C  benzonatate (TESSALON) 100 MG capsule Take 1 capsule (100 mg total) by mouth every 8 (eight) hours. 05/31/20   Joy, Shawn C, PA-C  buPROPion HCl (WELLBUTRIN XL PO) Take by mouth.    [provider]  hydrOXYzine (ATARAX/VISTARIL) 25 MG tablet Take 1 tablet (25 mg total) by mouth every 6 (six) hours. 09/22/19   Joy, Shawn C, PA-C  ibuprofen (ADVIL,MOTRIN) 800 MG tablet Take 1 tablet (800 mg total) by mouth every 8 (eight) hours as needed. 05/04/16   Mesner, Corene Cornea, MD    Allergies    Bee venom  Review of Systems  Review of Systems  Constitutional:  Negative for chills and fever.  HENT:  Negative for congestion and rhinorrhea.   Eyes:  Negative for redness and visual disturbance.  Respiratory:  Negative for shortness of breath and wheezing.   Cardiovascular:  Negative for chest pain and palpitations.  Gastrointestinal:  Negative for nausea and vomiting.  Genitourinary:  Negative for dysuria and urgency.  Musculoskeletal:  Negative for arthralgias and myalgias.  Skin:  Positive for wound. Negative for pallor.  Neurological:  Negative for dizziness and headaches.   Physical Exam Updated Vital Signs BP 127/88 (BP Location: Right Arm)   Pulse 68   Temp 98.2 F (36.8 C) (Oral)   Resp 18   Ht 5\' 10"  (1.778 m)   Wt 76.2 kg   LMP 05/07/2020   SpO2 97%   BMI 24.11 kg/m   Physical Exam Vitals and nursing note reviewed.  Constitutional:      General: She is not in acute distress.    Appearance: She is well-developed. She is not diaphoretic.  HENT:     Head: Normocephalic and atraumatic.  Eyes:     Pupils: Pupils are equal, round, and reactive to light.  Cardiovascular:     Rate and  Rhythm: Normal rate and regular rhythm.     Heart sounds: No murmur heard.   No friction rub. No gallop.  Pulmonary:     Effort: Pulmonary effort is normal.     Breath sounds: No wheezing or rales.  Abdominal:     General: There is no distension.     Palpations: Abdomen is soft.     Tenderness: There is no abdominal tenderness.  Musculoskeletal:        General: No tenderness.     Cervical back: Normal range of motion and neck supple.     Comments: Scab to the palm of the hand.   Skin:    General: Skin is warm and dry.  Neurological:     Mental Status: She is alert and oriented to person, place, and time.  Psychiatric:        Behavior: Behavior normal.    ED Results / Procedures / Treatments   Labs (all labs ordered are listed, but only abnormal results are displayed) Labs Reviewed - No data to display  EKG None  Radiology DG Hand Complete Left  Result Date: 05/26/2021 CLINICAL DATA:  Laceration.  Rule out foreign body. EXAM: LEFT HAND - COMPLETE 3+ VIEW COMPARISON:  None. FINDINGS: There is no evidence of fracture or dislocation. There is no evidence of arthropathy or other focal bone abnormality. Soft tissues are unremarkable. Negative for foreign body. IMPRESSION: Negative. Electronically Signed   By: 13/04/2021 M.D.   On: 05/26/2021 20:36    Procedures Procedures   Medications Ordered in ED Medications  lidocaine-EPINEPHrine (XYLOCAINE W/EPI) 2 %-1:200000 (PF) injection 10 mL (10 mLs Intradermal Given by Other 05/26/21 2037)    ED Course  I have reviewed the triage vital signs and the nursing notes.  Pertinent labs & imaging results that were available during my care of the patient were reviewed by me and considered in my medical decision making (see chart for details).    MDM Rules/Calculators/A&P                           30 yo F with a cc of a wound to the hand.  Punched a window.    No wound requiring repair,  no obvious foreign body.  D/c  home.  9:01 PM:  I have discussed the diagnosis/risks/treatment options with the patient and believe the pt to be eligible for discharge home to follow-up with PCP. We also discussed returning to the ED immediately if new or worsening sx occur. We discussed the sx which are most concerning (e.g., sudden worsening pain, fever, inability to tolerate by mouth) that necessitate immediate return. Medications administered to the patient during their visit and any new prescriptions provided to the patient are listed below.  Medications given during this visit Medications  lidocaine-EPINEPHrine (XYLOCAINE W/EPI) 2 %-1:200000 (PF) injection 10 mL (10 mLs Intradermal Given by Other 05/26/21 2037)     The patient appears reasonably screen and/or stabilized for discharge and I doubt any other medical condition or other [facility] requiring further screening, evaluation, or treatment in the ED at this time prior to discharge.   Final Clinical Impression(s) / ED Diagnoses Final diagnoses:  Injury of left hand, initial encounter    Rx / DC Orders ED Discharge Orders     None        Deno Etienne, DO 05/26/21 2101

## 2021-09-04 ENCOUNTER — Emergency Department (HOSPITAL_BASED_OUTPATIENT_CLINIC_OR_DEPARTMENT_OTHER)
Admission: EM | Admit: 2021-09-04 | Discharge: 2021-09-04 | Disposition: A | Discharge status: Home / Self Care | Payer: Managed Care, Other (non HMO) | Attending: Emergency Medicine | Admitting: Emergency Medicine

## 2021-09-04 ENCOUNTER — Encounter (HOSPITAL_BASED_OUTPATIENT_CLINIC_OR_DEPARTMENT_OTHER): Payer: Self-pay | Admitting: Emergency Medicine

## 2021-09-04 ENCOUNTER — Other Ambulatory Visit: Payer: Self-pay

## 2021-09-04 DIAGNOSIS — K625 Hemorrhage of anus and rectum: Secondary | ICD-10-CM | POA: Diagnosis not present

## 2021-09-04 DIAGNOSIS — R110 Nausea: Secondary | ICD-10-CM | POA: Insufficient documentation

## 2021-09-04 DIAGNOSIS — R42 Dizziness and giddiness: Secondary | ICD-10-CM

## 2021-09-04 LAB — URINALYSIS, ROUTINE W REFLEX MICROSCOPIC
Bilirubin Urine: NEGATIVE
Glucose, UA: NEGATIVE mg/dL
Hgb urine dipstick: NEGATIVE
Ketones, ur: NEGATIVE mg/dL
Leukocytes,Ua: NEGATIVE
Nitrite: NEGATIVE
Protein, ur: 30 mg/dL — AB
Specific Gravity, Urine: 1.02 (ref 1.005–1.030)
pH: 7 (ref 5.0–8.0)

## 2021-09-04 LAB — URINALYSIS, MICROSCOPIC (REFLEX)

## 2021-09-04 LAB — DIFFERENTIAL
Abs Immature Granulocytes: 0.02 10*3/uL (ref 0.00–0.07)
Basophils Absolute: 0.1 10*3/uL (ref 0.0–0.1)
Basophils Relative: 1 %
Eosinophils Absolute: 0.1 10*3/uL (ref 0.0–0.5)
Eosinophils Relative: 1 %
Immature Granulocytes: 0 %
Lymphocytes Relative: 34 %
Lymphs Abs: 1.9 10*3/uL (ref 0.7–4.0)
Monocytes Absolute: 0.5 10*3/uL (ref 0.1–1.0)
Monocytes Relative: 9 %
Neutro Abs: 3 10*3/uL (ref 1.7–7.7)
Neutrophils Relative %: 55 %

## 2021-09-04 LAB — CBC
HCT: 43.7 % (ref 36.0–46.0)
Hemoglobin: 14.9 g/dL (ref 12.0–15.0)
MCH: 30.6 pg (ref 26.0–34.0)
MCHC: 34.1 g/dL (ref 30.0–36.0)
MCV: 89.7 fL (ref 80.0–100.0)
Platelets: 254 10*3/uL (ref 150–400)
RBC: 4.87 MIL/uL (ref 3.87–5.11)
RDW: 13.1 % (ref 11.5–15.5)
WBC: 5.4 10*3/uL (ref 4.0–10.5)
nRBC: 0 % (ref 0.0–0.2)

## 2021-09-04 LAB — COMPREHENSIVE METABOLIC PANEL
ALT: 27 U/L (ref 0–44)
AST: 20 U/L (ref 15–41)
Albumin: 4.4 g/dL (ref 3.5–5.0)
Alkaline Phosphatase: 38 U/L (ref 38–126)
Anion gap: 6 (ref 5–15)
BUN: 12 mg/dL (ref 6–20)
CO2: 25 mmol/L (ref 22–32)
Calcium: 9.3 mg/dL (ref 8.9–10.3)
Chloride: 103 mmol/L (ref 98–111)
Creatinine, Ser: 0.66 mg/dL (ref 0.44–1.00)
GFR, Estimated: >60 mL/min (ref >60)
Glucose, Bld: 85 mg/dL (ref 70–99)
Potassium: 3.6 mmol/L (ref 3.5–5.1)
Sodium: 134 mmol/L — ABNORMAL LOW (ref 135–145)
Total Bilirubin: 1.6 mg/dL — ABNORMAL HIGH (ref 0.3–1.2)
Total Protein: 8 g/dL (ref 6.5–8.1)

## 2021-09-04 LAB — PREGNANCY, URINE: Preg Test, Ur: NEGATIVE

## 2021-09-04 LAB — OCCULT BLOOD X 1 CARD TO LAB, STOOL: Fecal Occult Bld: POSITIVE — AB

## 2021-09-04 LAB — LIPASE, BLOOD: Lipase: 26 U/L (ref 11–51)

## 2021-09-04 MED ORDER — PANTOPRAZOLE SODIUM 40 MG IV SOLR: 40.0000 mg | Freq: Once | INTRAVENOUS | Status: DC

## 2021-09-04 MED ORDER — SODIUM CHLORIDE 0.9 % IV BOLUS
1000.0000 mL | Freq: Once | INTRAVENOUS | Status: AC
  Administered 2021-09-04: 1000 mL via INTRAVENOUS

## 2021-09-04 MED ORDER — METOCLOPRAMIDE HCL 5 MG/ML IJ SOLN
10.0000 mg | Freq: Once | INTRAMUSCULAR | Status: AC
  Administered 2021-09-04: 10 mg via INTRAVENOUS
  Filled 2021-09-04: qty 2

## 2021-09-04 MED ORDER — DIPHENHYDRAMINE HCL 50 MG/ML IJ SOLN
12.5000 mg | Freq: Once | INTRAMUSCULAR | Status: AC
  Administered 2021-09-04: 12.5 mg via INTRAVENOUS
  Filled 2021-09-04: qty 1

## 2021-09-04 NOTE — ED Provider Notes (Signed)
[facility] EMERGENCY DEPARTMENT Provider Note   CSN: [id number] Arrival date & time: 09/04/21  1352     History  Chief Complaint  Patient presents with   Rectal Bleeding   Dizziness    Rachael Pope is a 31 y.o. female.   Rectal Bleeding Associated symptoms: dizziness   Dizziness  Patient presents with painless rectal bleeding intermittent dizziness.  States yesterday she had 2 bowel movements which produced bright red blood in the toilet bowl as well as passing a clot.  Denies any specific rectal pain, does have lower abdominal cramping which is intermittent.  Feels like a squeezing pain, does not radiate elsewhere.  Using the restroom makes it worse, there is no associated dysuria or hematuria.  Associated with Nausea and HA. No vomiting, fevers, vaginal bleeding, vaginal pain.  She is not on blood thinners, has not drinking alcohol in 3 weeks but does consume alcohol socially.  No history of gastric ulcers.  Previous surgical history is notable for total hysterectomy.  Of note, patient had a fall and is in a splint to her left arm due to wrist fracture.  She is not taking any anti-inflammatory, pain has been controlled with Tylenol.  Home Medications Prior to Admission medications   Medication Sig Start Date End Date Taking? Authorizing Provider  albuterol (VENTOLIN HFA) 108 (90 Base) MCG/ACT inhaler Inhale 1-2 puffs into the lungs every 6 (six) hours as needed for wheezing or shortness of breath. 05/31/20   Joy, Shawn C, PA-C  atomoxetine (STRATTERA) 40 MG capsule Take 40 mg by mouth daily.    [provider]  benzonatate (TESSALON) 100 MG capsule Take 1 capsule (100 mg total) by mouth every 8 (eight) hours. 05/31/20   Joy, Shawn C, PA-C  buPROPion HCl (WELLBUTRIN XL PO) Take by mouth.    [provider]  hydrOXYzine (ATARAX/VISTARIL) 25 MG tablet Take 1 tablet (25 mg total) by mouth every 6 (six) hours. 09/22/19   Joy, Shawn C, PA-C   ibuprofen (ADVIL,MOTRIN) 800 MG tablet Take 1 tablet (800 mg total) by mouth every 8 (eight) hours as needed. 05/04/16   Mesner, Corene Cornea, MD  paliperidone (INVEGA) 6 MG 24 hr tablet Take 6 mg by mouth daily.    [provider]      Allergies    Bee venom and Other    Review of Systems   Review of Systems  Gastrointestinal:  Positive for hematochezia.  Neurological:  Positive for dizziness.   Physical Exam Updated Vital Signs BP 102/73    Pulse 65    Temp 98 F (36.7 C) (Oral)    Resp 16    LMP 05/07/2020    SpO2 100%  Physical Exam Vitals and nursing note reviewed. Exam conducted with a chaperone present.  Constitutional:      Appearance: Normal appearance.  HENT:     Head: Normocephalic and atraumatic.     Mouth/Throat:     Mouth: Mucous membranes are moist.  Eyes:     General: No scleral icterus.       Right eye: No discharge.        Left eye: No discharge.     Extraocular Movements: Extraocular movements intact.     Pupils: Pupils are equal, round, and reactive to light.  Cardiovascular:     Rate and Rhythm: Normal rate and regular rhythm.     Pulses: Normal pulses.     Heart sounds: Normal heart sounds. No murmur heard.  No friction rub. No gallop.     Comments: Regular rhythm Pulmonary:     Effort: Pulmonary effort is normal. No respiratory distress.     Breath sounds: Normal breath sounds.  Abdominal:     General: Abdomen is flat. Bowel sounds are normal. There is no distension.     Palpations: Abdomen is soft.     Tenderness: There is no abdominal tenderness.     Comments: Abdomen is soft without rigidity.  Mild suprapubic pain, distractible.  Genitourinary:    Comments: Rectal exam performed with chaperone in room.  No fissure or external hemorrhoids visualized, normal rectal tone.  No rectal bleeding on exam, stool does not appear grossly melanic. Skin:    General: Skin is warm and dry.     Coloration: Skin is not jaundiced.  Neurological:      Mental Status: She is alert. Mental status is at baseline.     Coordination: Coordination normal.    ED Results / Procedures / Treatments   Labs (all labs ordered are listed, but only abnormal results are displayed) Labs Reviewed  COMPREHENSIVE METABOLIC PANEL - Abnormal; Notable for the following components:      Result Value   Sodium 134 (*)    Total Bilirubin 1.6 (*)    All other components within normal limits  URINALYSIS, ROUTINE W REFLEX MICROSCOPIC - Abnormal; Notable for the following components:   Protein, ur 30 (*)    All other components within normal limits  OCCULT BLOOD X 1 CARD TO LAB, STOOL - Abnormal; Notable for the following components:   Fecal Occult Bld POSITIVE (*)    All other components within normal limits  URINALYSIS, MICROSCOPIC (REFLEX) - Abnormal; Notable for the following components:   Bacteria, UA MANY (*)    All other components within normal limits  CBC  PREGNANCY, URINE  DIFFERENTIAL  LIPASE, BLOOD    EKG None  Radiology No results found.  Procedures Procedures    Medications Ordered in ED Medications  sodium chloride 0.9 % bolus 1,000 mL ( Intravenous Stopped 09/04/21 1707)  metoCLOPramide (REGLAN) injection 10 mg (10 mg Intravenous Given 09/04/21 1607)  diphenhydrAMINE (BENADRYL) injection 12.5 mg (12.5 mg Intravenous Given 09/04/21 1606)    ED Course/ Medical Decision Making/ A&P                           Medical Decision Making Amount and/or Complexity of Data Reviewed Labs: ordered.  Risk Prescription drug management.   This patient presents to the ED for concern of rectal bleeding and dizziness, this involves an extensive number of treatment options, and is a complaint that carries with it a high risk of complications and morbidity.  The differential diagnosis includes but not limited to: acute GI upper GIB, hemorrhagic shock, ischemic colitis, diverticulitis, AVMs   BP 105/72    Pulse 66    Temp 98 F (36.7 C) (Oral)     Resp 18       SpO2 100%  -Orthostatic vitals ordered in triage positive.  Symptoms not reproduced. No focal tenderness, no peritoneal signs.  Ordered fluid bolus and Reglan and Benadryl for the headache and reported dizziness.     Labs ordered by myself include CBC, CMP, UA, lipase, FOBT.   I independently reviewed the labs and pertinent results are as follows:  -CBC without any signs of anemia (RBC 4.87, Hgb 14.9). FOBT positive. Compared to most recent labs from 1 year  ago this is grossly unchanged.  -There is no gross electrolyte derangement or AKI, UA and lactic normal.     I reviewed the EKG which does not show any signs of tachycardia, ischemic findings, atrial fibrillation   Reevaluation: Patient feels improved, has not any episodes of rectal bleeding while in ED.  Dizziness resolved.  I viewed the cardiac monitoring while in the room which shows sinus rhythm with a heart rate of 69.  Serial abdominal exams benign.   Considerations -  Does not meet SIRS criteria.  Not in hemorrhagic shock.  Additionally, her hemoglobin is stable and she does not require transfusion as no unstable GIB. Recal exam did not show any signs of an anal fissure or hemorrhoids.  PE not consistent with infectious colitis or mesenteric ischemia.     I considered ordering CT abdomen but given the lack of focal tenderness, low suspicion for mesenteric ischemia, and patient's improvement in symptoms do not feel needed in the ED.   Dx-rectal bleeding, dizziness   Rx - N/A. Avoid NSAIDs, ETOH.    Considered admission for observation and additional work-up, however ultimately feel patient is stable for outpatient follow-up with strict return precautions.  Patient is in agreement with plan.          Final Clinical Impression(s) / ED Diagnoses Final diagnoses:  Rectal bleeding  Dizziness    Rx / DC Orders ED Discharge Orders     None         Sherrill Raring, Hershal Coria 09/04/21 2342    Drenda Freeze, MD 09/05/21 [phone]

## 2021-09-04 NOTE — Discharge Instructions (Addendum)
Please call the gastroenterology practice listed above Monday morning to schedule a follow-up appointment.  Work-up today did not show a source for the bleeding, I suspect he may need a colonoscopy done outpatient.  In the meantime avoid any anti-inflammatory medicine.  Drink plenty of fluids, return to the ED for immediate valuation if you have worsening blood loss, severe rectal or abdominal pain, worsening symptoms.

## 2021-09-04 NOTE — ED Notes (Signed)
Pt given sprite with verbal approval from [facility]

## 2021-09-04 NOTE — ED Triage Notes (Addendum)
Pt reports she had 2 episodes of rectal bleeding since yesterday. Today feels lightheaded, has a HA, and abd pain. Alert, oriented, and ambulatory to triage. Not on blood thinners.

## 2021-09-25 IMAGING — CR DG CHEST 2V
2 series · 2 of 2 positions shown · non-contrast
Comparison: April 15, 2018

CLINICAL DATA: Chest pain and shortness of breath

EXAM:
CHEST - 2 VIEW

[w chest pa]
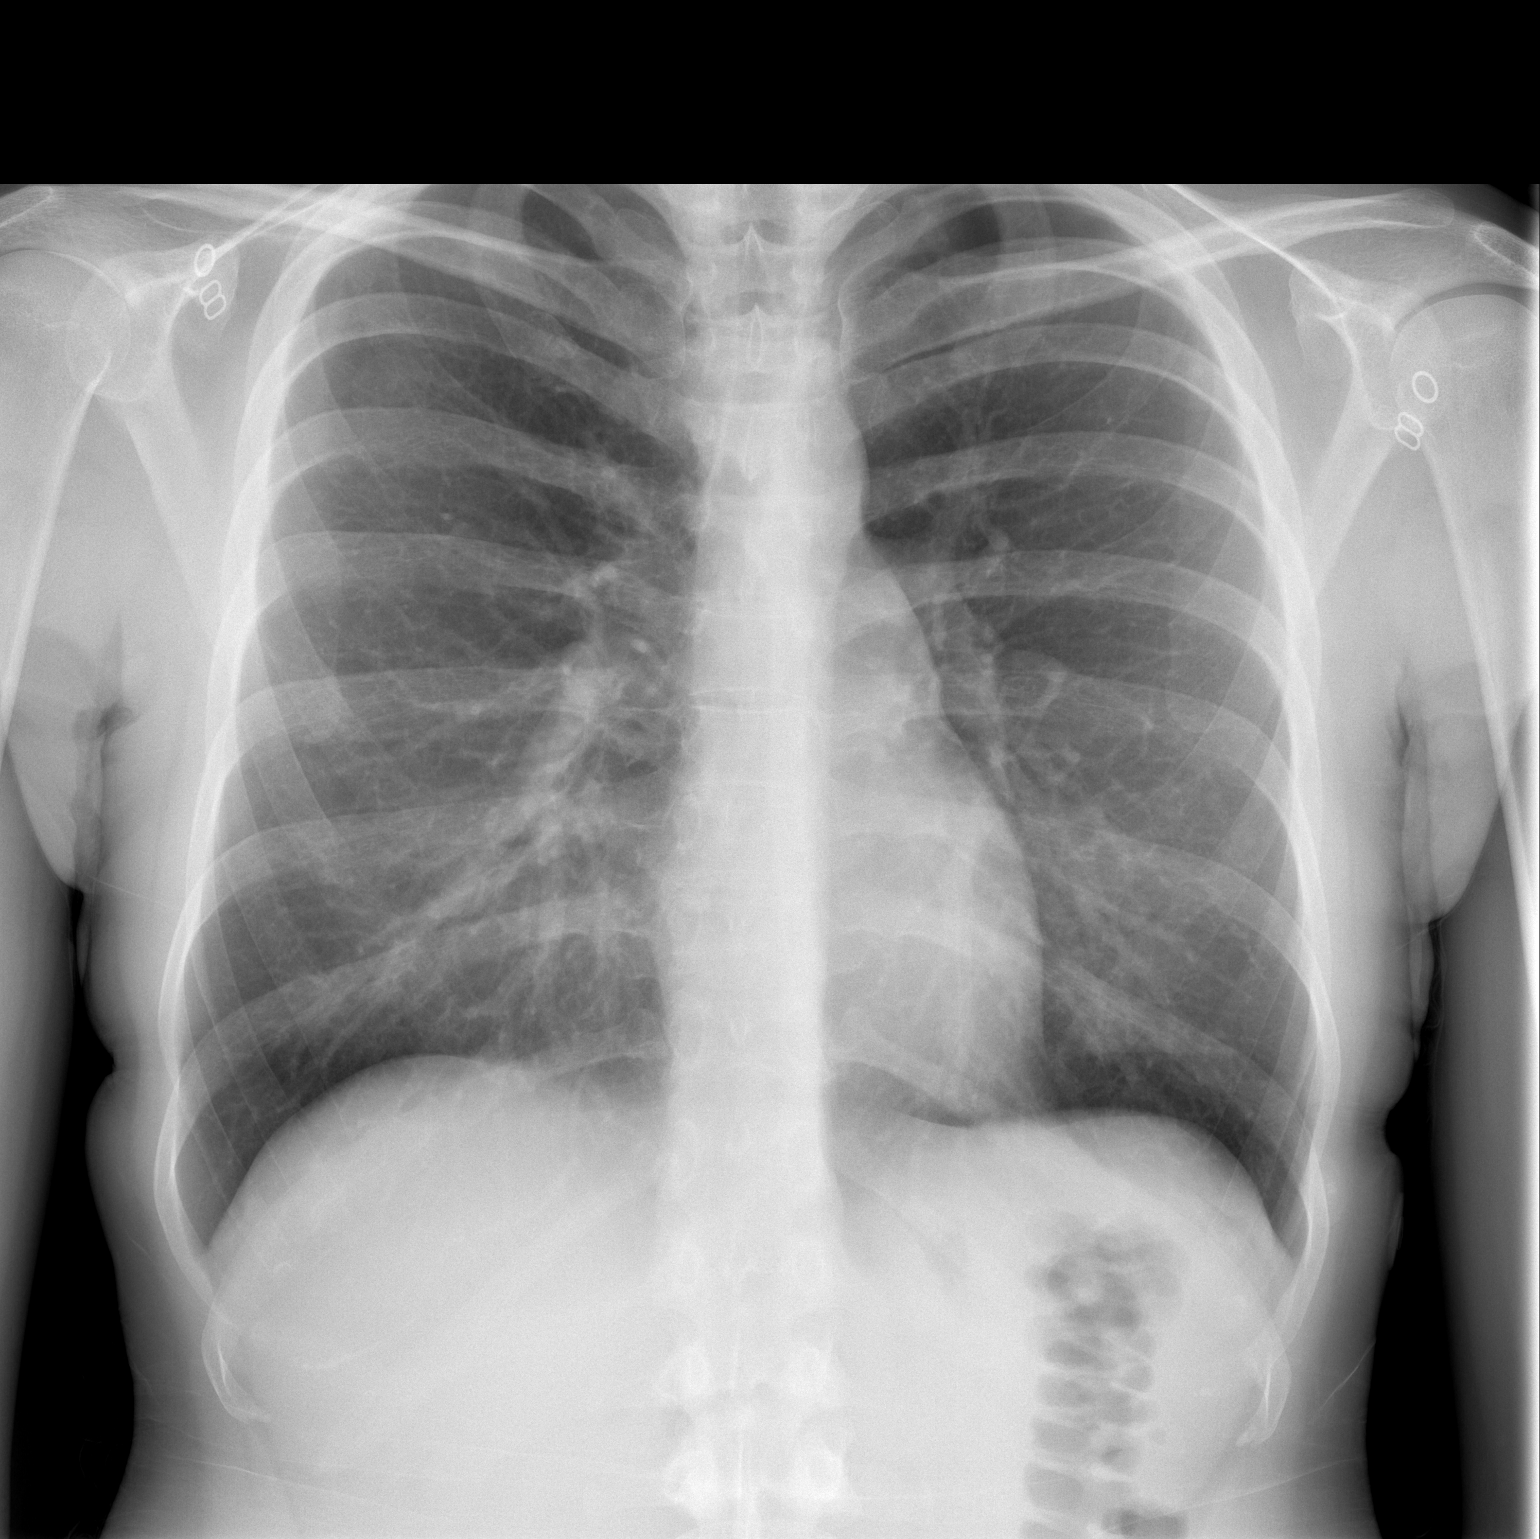

[w chest lat]
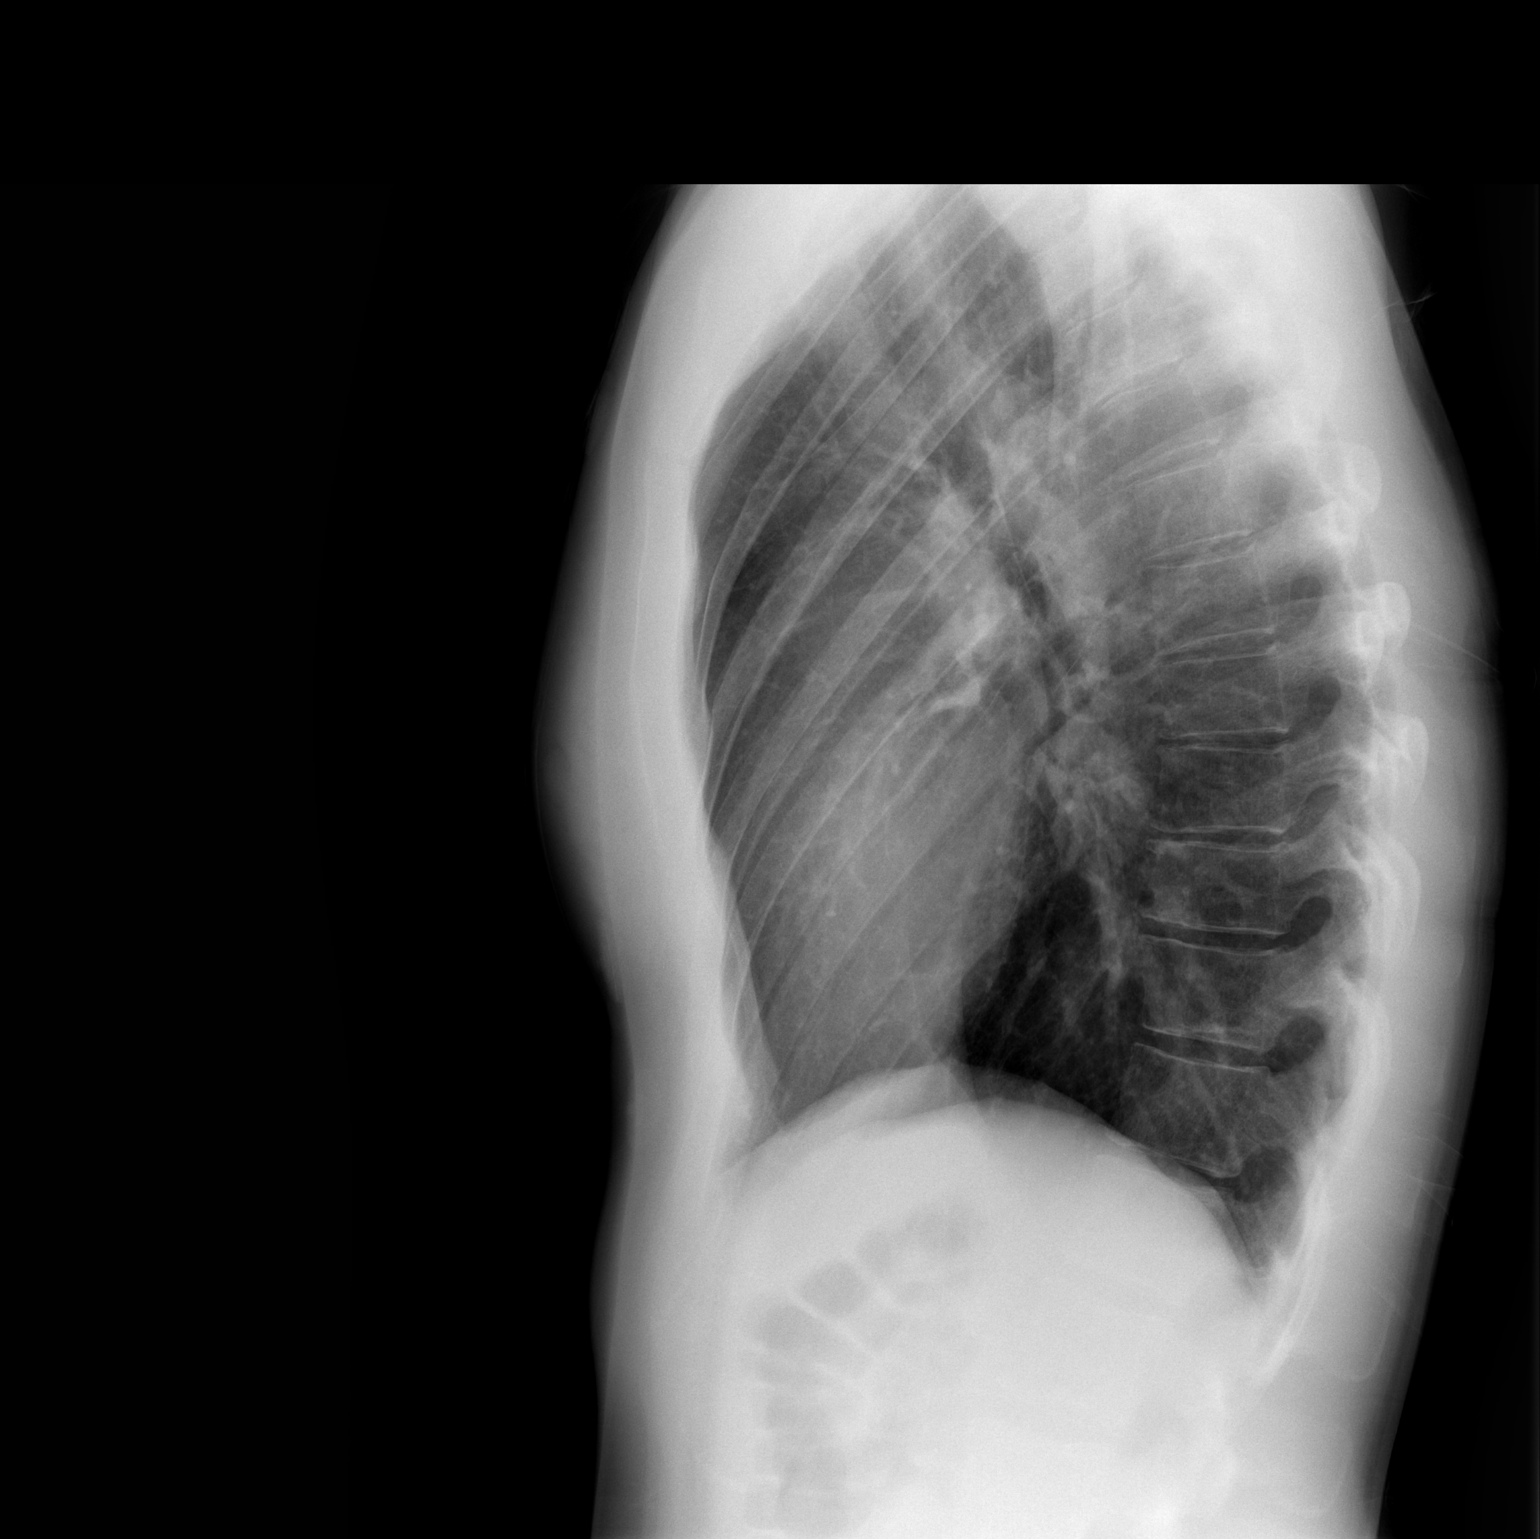

[2 of 2 positions shown; findings below may reference images not displayed]

FINDINGS: Lungs are clear. Heart size and pulmonary vascularity are normal. No
adenopathy. No pneumothorax. No bone lesions.
IMPRESSION: No abnormality noted.

## 2023-04-29 ENCOUNTER — Encounter (HOSPITAL_BASED_OUTPATIENT_CLINIC_OR_DEPARTMENT_OTHER): Payer: Self-pay | Admitting: Emergency Medicine

## 2023-04-29 ENCOUNTER — Emergency Department (HOSPITAL_BASED_OUTPATIENT_CLINIC_OR_DEPARTMENT_OTHER): Payer: Managed Care, Other (non HMO)

## 2023-04-29 ENCOUNTER — Emergency Department (HOSPITAL_BASED_OUTPATIENT_CLINIC_OR_DEPARTMENT_OTHER)
Admission: EM | Admit: 2023-04-29 | Discharge: 2023-04-29 | Disposition: A | Discharge status: Home / Self Care | Payer: Managed Care, Other (non HMO) | Attending: Emergency Medicine | Admitting: Emergency Medicine

## 2023-04-29 ENCOUNTER — Other Ambulatory Visit: Payer: Self-pay

## 2023-04-29 DIAGNOSIS — S67191A Crushing injury of left index finger, initial encounter: Secondary | ICD-10-CM | POA: Insufficient documentation

## 2023-04-29 DIAGNOSIS — W230XXA Caught, crushed, jammed, or pinched between moving objects, initial encounter: Secondary | ICD-10-CM | POA: Insufficient documentation

## 2023-04-29 DIAGNOSIS — S6992XA Unspecified injury of left wrist, hand and finger(s), initial encounter: Secondary | ICD-10-CM | POA: Diagnosis present

## 2023-04-29 MED ORDER — IBUPROFEN 800 MG PO TABS
800.0000 mg | ORAL_TABLET | Freq: Once | ORAL | Status: AC
  Administered 2023-04-29: 800 mg via ORAL
  Filled 2023-04-29: qty 1

## 2023-04-29 NOTE — ED Provider Notes (Signed)
[facility] Provider Note   CSN: [id number] Arrival date & time: 04/29/23  1709     History  Chief Complaint  Patient presents with   Finger Injury    Rachael Pope is a 32 y.o. female with no significant past medical history presents with concern for left index finger pain after shutting it in a truck bed prior to arrival.  Has an abrasion and pain over the PIP of the left index finger.  Denies any numbness or tingling in the finger.  HPI     Home Medications Prior to Admission medications   Medication Sig Start Date End Date Taking? Authorizing Provider  albuterol (VENTOLIN HFA) 108 (90 Base) MCG/ACT inhaler Inhale 1-2 puffs into the lungs every 6 (six) hours as needed for wheezing or shortness of breath. 05/31/20   Joy, Shawn C, PA-C  atomoxetine (STRATTERA) 40 MG capsule Take 40 mg by mouth daily.    [provider]  benzonatate (TESSALON) 100 MG capsule Take 1 capsule (100 mg total) by mouth every 8 (eight) hours. 05/31/20   Joy, Shawn C, PA-C  buPROPion HCl (WELLBUTRIN XL PO) Take by mouth.    [provider]  hydrOXYzine (ATARAX/VISTARIL) 25 MG tablet Take 1 tablet (25 mg total) by mouth every 6 (six) hours. 09/22/19   Joy, Shawn C, PA-C  ibuprofen (ADVIL,MOTRIN) 800 MG tablet Take 1 tablet (800 mg total) by mouth every 8 (eight) hours as needed. 05/04/16   Mesner, Barbara Cower, MD  paliperidone (INVEGA) 6 MG 24 hr tablet Take 6 mg by mouth daily.    [provider]      Allergies    Bee venom and Other    Review of Systems   Review of Systems  Physical Exam Updated Vital Signs BP 126/88   Pulse 87   Temp 98.5 F (36.9 C) (Oral)   Resp 18   Ht 5\' 10"  (1.778 m)   Wt 87.1 kg   LMP 05/07/2020   SpO2 99%   BMI 27.55 kg/m  Physical Exam Vitals and nursing note reviewed.  Constitutional:      Appearance: Normal appearance.  HENT:     Head: Atraumatic.  Cardiovascular:     Comments:  Cap refill in the left second digit under 2 seconds, left radial pulse 2+  Pulmonary:     Effort: Pulmonary effort is normal.  Musculoskeletal:     Comments: No obvious deformity of the left hand or left 1st through 5th digits, no erythema, no edema.  Small 1 cm abrasion over the left second digit over the PIP, bleeding well-controlled.  Tender to palpation over the left second DIP joint where the abrasion is.  No tenderness to palpation over the proximal phalanx, middle phalanx, distal phalanx, DIP, MCP of the left second digit.  Able to flex and extend fully at the MCP, DIP of the left second digit. Only able to do minimal flexion and extension of the left 2nd PIP secondary to pain  Intact sensation of the left second digit  Skin:    Comments: 1 cm abrasion over the left second digit PIP, bleeding well-controlled  Neurological:     General: No focal deficit present.     Mental Status: She is alert.  Psychiatric:        Mood and Affect: Mood normal.        Behavior: Behavior normal.     ED Results / Procedures / Treatments   Labs (  all labs ordered are listed, but only abnormal results are displayed) Labs Reviewed - No data to display  EKG None  Radiology DG Finger Index Left  Result Date: 04/29/2023 CLINICAL DATA:  Trauma to the left index finger. EXAM: LEFT INDEX FINGER 2+V COMPARISON:  Left hand radiograph dated 05/26/2021. FINDINGS: There is no acute fracture or dislocation. The bones are well mineralized. No arthritic changes. Mild soft tissue swelling over the PIP joint of the index finger. No radiopaque foreign object or soft tissue gas. IMPRESSION: 1. No acute fracture or dislocation. 2. Mild soft tissue swelling. Electronically Signed   By: Elgie Collard M.D.   On: 04/29/2023 18:45    Procedures Procedures    Medications Ordered in ED Medications  ibuprofen (ADVIL) tablet 800 mg (800 mg Oral Given 04/29/23 1902)    ED Course/ Medical Decision Making/ A&P                                  Medical Decision Making Amount and/or Complexity of Data Reviewed Radiology: ordered.   32 y.o. female presents to the ED for concern of pain over the left index finger PIP joint that occurred after shutting a truck bed door on it just prior to arrival  Differential diagnosis includes but is not limited to fracture, dislocation, tendon injury, contusion, abrasion, laceration  ED Course:  Patient closed her left index finger in a truck bed door just prior to arrival.  Has pain over the left second digit PIP, this is also where there is a small abrasion.  She is able to fully flex and extend at the left second digit MCP, PIP, DIP.  No concern for tendon injury at this time.  Intact sensation of the left second digit.  Cap refill under 2 seconds of the left second digit.  X-rays without any signs of fracture or dislocation. Patient given 800 mg ibuprofen for pain   Impression: Crush injury to left second digit Small abrasion over left second digit PIP joint  Disposition:  The patient was discharged home with instructions to keep the abrasion clean with soap and water, cover with antibiotic ointment and allow to heal.  Use Tylenol and ibuprofen as needed for pain. Return precautions given.   Imaging Studies ordered: I ordered imaging studies including x-ray left index finger I independently visualized the imaging with scope of interpretation limited to determining acute life threatening conditions related to emergency care. Imaging showed no acute fracture or dislocation I agree with the radiologist interpretation           Final Clinical Impression(s) / ED Diagnoses Final diagnoses:  Crushing injury of left index finger, initial encounter    Rx / DC Orders ED Discharge Orders     None         Arabella Merles, Cordelia Poche 04/29/23 1915    Lonell Grandchild, MD 04/29/23 [phone]

## 2023-04-29 NOTE — ED Triage Notes (Addendum)
Pt reports she "slammed her finger in a tailgate" at 1630 today, left pointer finger is swollen and has a small puncture type wound

## 2023-04-29 NOTE — Discharge Instructions (Signed)
Your x-ray showed no signs of a fracture or dislocation.  Please keep the cut to your finger clean with soap and water and allow it to heal over.  You may apply antibiotic ointment over it and cover with a bandage.  You may take up to 1000mg  of tylenol every 6 hours as needed for pain.  Do not take more then 4g per day.  You were given ibuprofen here for pain. You may use up to 800mg  ibuprofen every 8 hours as needed for pain. Your next dose can be no sooner than 3am.  Do not exceed 2.4g of ibuprofen per day.  Return to the ER for any numbness or tingling in the fingers, severe worsening pain, any other new or concerning symptoms.

## 2023-05-30 IMAGING — DX DG HAND COMPLETE 3+V*L*
3 series · 3 of 3 positions shown · non-contrast
Comparison: None.

CLINICAL DATA: Laceration.  Rule out foreign body.

EXAM:
LEFT HAND - COMPLETE 3+ VIEW

[hand ap]
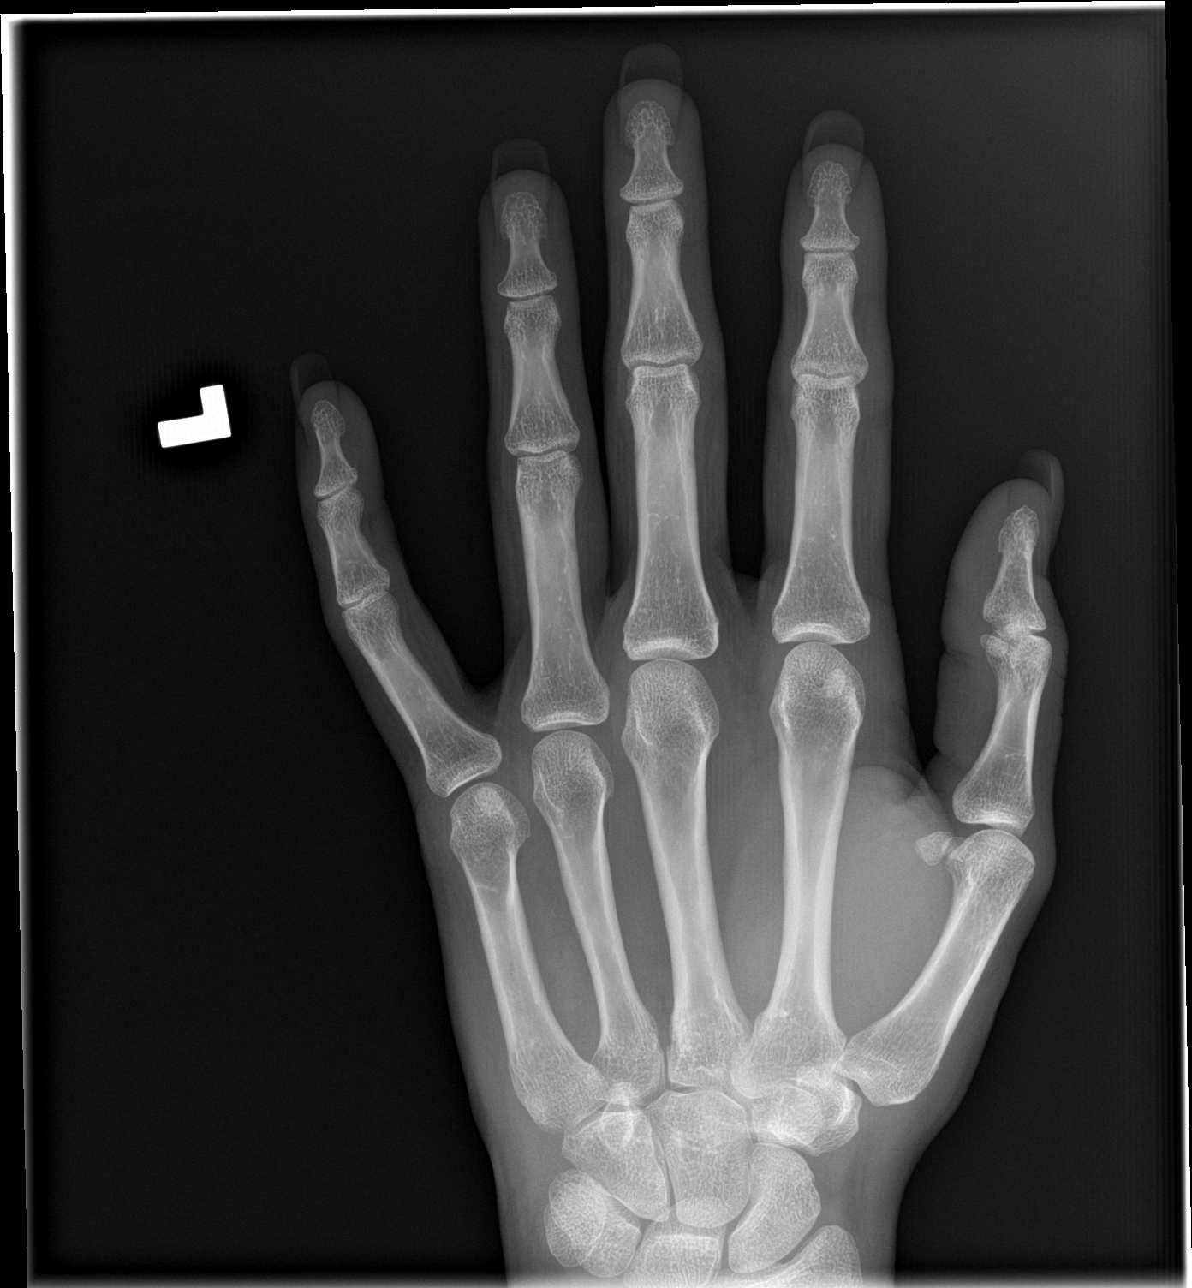

[hand obl]
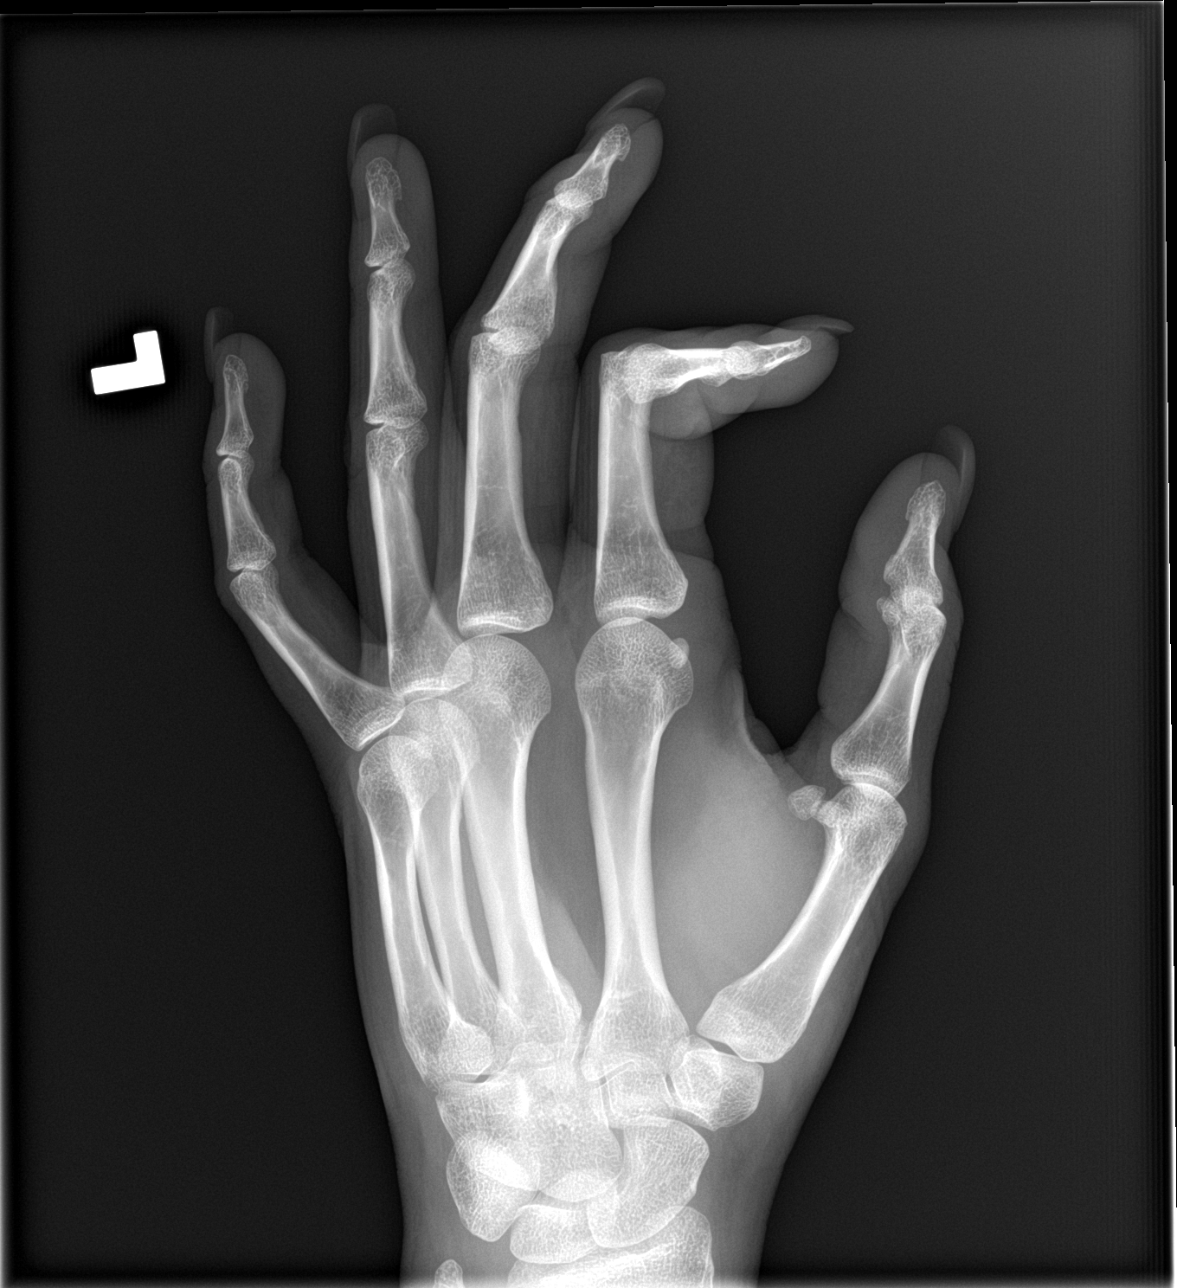

[hand lat]
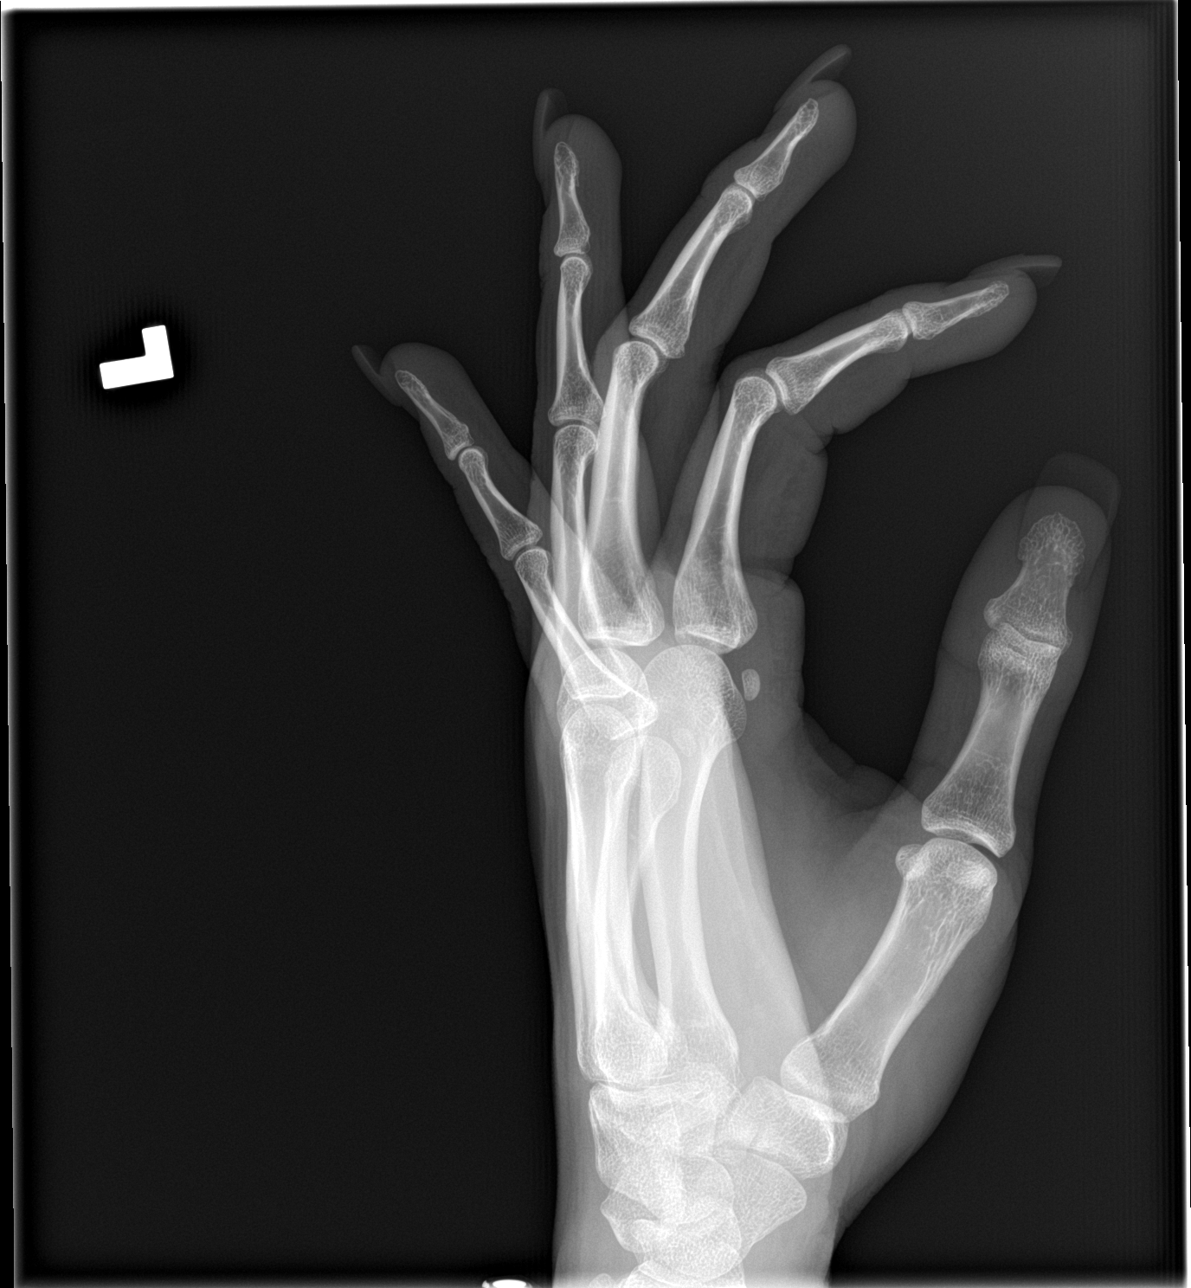

[3 of 3 positions shown; findings below may reference images not displayed]

FINDINGS: There is no evidence of fracture or dislocation. There is no
evidence of arthropathy or other focal bone abnormality. Soft
tissues are unremarkable. Negative for foreign body.
IMPRESSION: Negative.

## 2023-09-05 ENCOUNTER — Ambulatory Visit: Payer: Managed Care, Other (non HMO) | Admitting: Neurology

## 2023-12-24 ENCOUNTER — Ambulatory Visit: Payer: Managed Care, Other (non HMO) | Admitting: Neurology

## 2024-01-07 ENCOUNTER — Encounter: Payer: Self-pay | Admitting: Neurology

## 2024-01-07 ENCOUNTER — Ambulatory Visit: Admitting: Neurology

## 2024-01-07 VITALS — BP 112/70 | HR 83 | Ht 71.0 in | Wt 192.0 lb

## 2024-01-07 DIAGNOSIS — H539 Unspecified visual disturbance: Secondary | ICD-10-CM | POA: Diagnosis not present

## 2024-01-07 DIAGNOSIS — G43109 Migraine with aura, not intractable, without status migrainosus: Secondary | ICD-10-CM | POA: Insufficient documentation

## 2024-01-07 DIAGNOSIS — R51 Headache with orthostatic component, not elsewhere classified: Secondary | ICD-10-CM | POA: Diagnosis not present

## 2024-01-07 DIAGNOSIS — R519 Headache, unspecified: Secondary | ICD-10-CM

## 2024-01-07 DIAGNOSIS — G43709 Chronic migraine without aura, not intractable, without status migrainosus: Secondary | ICD-10-CM | POA: Diagnosis not present

## 2024-01-07 MED ORDER — FREMANEZUMAB-VFRM 225 MG/1.5ML ~~LOC~~ SOSY
225.0000 mg | PREFILLED_SYRINGE | Freq: Once | SUBCUTANEOUS | Status: AC
Start: 2024-01-07 — End: ?

## 2024-01-07 MED ORDER — UBRELVY 100 MG PO TABS: 100.0000 mg | ORAL_TABLET | ORAL | Status: AC | PRN

## 2024-01-07 MED ORDER — AJOVY 225 MG/1.5ML ~~LOC~~ SOAJ
225.0000 mg | SUBCUTANEOUS | 11 refills | Status: AC
Start: 2024-01-07 — End: ?

## 2024-01-07 NOTE — Patient Instructions (Addendum)
 MRI of the brain w/wo contrast Prevention: Start Ajovy, if insurance likes emgality better we can just switch Acute management: Samples ubrelvy, can always mychart me and we can prescribe   Discussed:  There is increased risk for stroke in women with migraine with aura and a contraindication for the combined contraceptive pill for use by women who have migraine with aura. The risk for women with migraine without aura is lower. However other risk factors like smoking are far more likely to increase stroke risk than migraine. There is a recommendation for no smoking and for the use of OCPs without estrogen such as progestogen only pills particularly for women with migraine with aura.SABRA People who have migraine headaches with auras may be 3 times more likely to have a stroke caused by a blood clot, compared to migraine patients who don't see auras. Women who take hormone-replacement therapy may be 30 percent more likely to suffer a clot-based stroke than women not taking medication containing estrogen. Other risk factors like smoking and high blood pressure may be  much more important. And stroke is still a rare complication due to migraine aura and is controversial and lower doses may not cause a risk.  Fremanezumab Injection What is this medication? FREMANEZUMAB (fre ma NEZ ue mab) prevents migraines. It works by blocking a substance in the body that causes migraines. It is a monoclonal antibody. This medicine may be used for other purposes; ask your health care provider or pharmacist if you have questions. COMMON BRAND NAME(S): AJOVY What should I tell my care team before I take this medication? They need to know if you have any of these conditions: An unusual or allergic reaction to fremanezumab, other medications, foods, dyes, or preservatives Pregnant or trying to get pregnant Breast-feeding How should I use this medication? This medication is injected under the skin. You will be taught how to  prepare and give it. Take it as directed on the prescription label. Keep taking it unless your care team tells you to stop. It is important that you put your used needles and syringes in a special sharps container. Do not put them in a trash can. If you do not have a sharps container, call your pharmacist or care team to get one. Talk to your care team about the use of this medication in children. Special care may be needed. Overdosage: If you think you have taken too much of this medicine contact a poison control center or emergency room at once. NOTE: This medicine is only for you. Do not share this medicine with others. What if I miss a dose? If you miss a dose, take it as soon as you can. If it is almost time for your next dose, take only that dose. Do not take double or extra doses. What may interact with this medication? Interactions are not expected. This list may not describe all possible interactions. Give your health care provider a list of all the medicines, herbs, non-prescription drugs, or dietary supplements you use. Also tell them if you smoke, drink alcohol, or use illegal drugs. Some items may interact with your medicine. What should I watch for while using this medication? Tell your care team if your symptoms do not start to get better or if they get worse. What side effects may I notice from receiving this medication? Side effects that you should report to your care team as soon as possible: Allergic reactions or angioedema--skin rash, itching or hives, swelling of the face, eyes,  lips, tongue, arms, or legs, trouble swallowing or breathing Side effects that usually do not require medical attention (report to your care team if they continue or are bothersome): Pain, redness, or irritation at injection site This list may not describe all possible side effects. Call your doctor for medical advice about side effects. You may report side effects to FDA at 1-800-FDA-1088. Where should  I keep my medication? Keep out of the reach of children and pets. Store in a refrigerator or at room temperature between 20 and 25 degrees C (68 and 77 degrees F). Refrigeration (preferred): Store in the refrigerator. Do not freeze. Keep in the original container until you are ready to take it. Remove the dose from the carton about 30 minutes before it is time for you to use it. If the dose is not used, it may be stored in the original container at room temperature for 7 days. Get rid of any unused medication after the expiration date. Room Temperature: This medication may be stored at room temperature for up to 7 days. Keep it in the original container. Protect from light until time of use. If it is stored at room temperature, get rid of any unused medication after 7 days or after it expires, whichever is first. To get rid of medications that are no longer needed or have expired: Take the medication to a medication take-back program. Check with your pharmacy or law enforcement to find a location. If you cannot return the medication, ask your pharmacist or care team how to get rid of this medication safely. NOTE: This sheet is a summary. It may not cover all possible information. If you have questions about this medicine, talk to your doctor, pharmacist, or health care provider.  2024 Elsevier/Gold Standard (2021-08-26 00:00:00)Ubrogepant Tablets What is this medication? UBROGEPANT (ue BROE je pant) treats migraines. It works by blocking a substance in the body that causes migraines. It is not used to prevent migraines. This medicine may be used for other purposes; ask your health care provider or pharmacist if you have questions. COMMON BRAND NAME(S): [place] What should I tell my care team before I take this medication? They need to know if you have any of these conditions: Kidney disease Liver disease An unusual or allergic reaction to ubrogepant, other medications, foods, dyes, or  preservatives Pregnant or trying to get pregnant Breast-feeding How should I use this medication? Take this medication by mouth with a glass of water. Take it as directed on the prescription label. You can take it with or without food. If it upsets your stomach, take it with food. Keep taking it unless your care team tells you to stop. Talk to your care team about the use of this medication in children. Special care may be needed. Overdosage: If you think you have taken too much of this medicine contact a poison control center or emergency room at once. NOTE: This medicine is only for you. Do not share this medicine with others. What if I miss a dose? This does not apply. This medication is not for regular use. What may interact with this medication? Do not take this medication with any of the following: Adagrasib Ceritinib Certain antibiotics, such as chloramphenicol, clarithromycin, telithromycin Certain antivirals for HIV, such as atazanavir, cobicistat, darunavir, delavirdine, fosamprenavir, indinavir, ritonavir Certain medications for fungal infections, such as itraconazole, ketoconazole, posaconazole, voriconazole Conivaptan Grapefruit Idelalisib Mifepristone Nefazodone Ribociclib This medication may also interact with the following: Carvedilol Certain medications for seizures, such as  phenobarbital, phenytoin Ciprofloxacin Cyclosporine Eltrombopag Fluconazole Fluvoxamine Quinidine Rifampin St. John's wort Verapamil This list may not describe all possible interactions. Give your health care provider a list of all the medicines, herbs, non-prescription drugs, or dietary supplements you use. Also tell them if you smoke, drink alcohol, or use illegal drugs. Some items may interact with your medicine. What should I watch for while using this medication? Visit your care team for regular checks on your progress. Tell your care team if your symptoms do not start to get better or  if they get worse. Your mouth may get dry. Chewing sugarless gum or sucking hard candy and drinking plenty of water may help. Contact your care team if the problem does not go away or is severe. What side effects may I notice from receiving this medication? Side effects that you should report to your care team as soon as possible: Allergic reactions--skin rash, itching, hives, swelling of the face, lips, tongue, or throat Side effects that usually do not require medical attention (report to your care team if they continue or are bothersome): Drowsiness Dry mouth Fatigue Nausea This list may not describe all possible side effects. Call your doctor for medical advice about side effects. You may report side effects to FDA at 1-800-FDA-1088. Where should I keep my medication? Keep out of the reach of children and pets. Store between 15 and 30 degrees C (59 and 86 degrees F). Get rid of any unused medication after the expiration date. To get rid of medications that are no longer needed or have expired: Take the medication to a medication take-back program. Check with your pharmacy or law enforcement to find a location. If you cannot return the medication, check the label or package insert to see if the medication should be thrown out in the garbage or flushed down the toilet. If you are not sure, ask your care team. If it is safe to put it in the trash, pour the medication out of the container. Mix the medication with cat litter, dirt, coffee grounds, or other unwanted substance. Seal the mixture in a bag or container. Put it in the trash. NOTE: This sheet is a summary. It may not cover all possible information. If you have questions about this medicine, talk to your doctor, pharmacist, or health care provider.  2024 Elsevier/Gold Standard (2021-08-26 00:00:00)

## 2024-01-07 NOTE — Progress Notes (Signed)
 [facility]    Provider:  Dr Ines Requesting Provider: Gretta Rosina DEL, NP Primary Care Provider:  [facility]  CC:  migraines  HPI:  Rachael Pope is a 33 y.o. female here as requested by Gretta Rosina DEL, NP for migraines. PMHx chronic migraines with aura without status migrainosus not intractable, nausea, chronic fatigue, anxiety, MDD, ADHD, bipolar 2.    She feels every day her head is killing her, moderate to severe, can get so bad she can't get out of bed it can be so severe, pulsating/pounding/throbbing, nausea, vomiting, beating in the temples, behind the eyeballs, can start in the back right side of the head as well, massage gun can help, nasuea, vomiting, photo/phono/osmophobia, hurts to move, started years ago 2019 started worsening but had them for longer than that since a teenager, now more freq and severe, progressing, a chiropractor helps, she has associated neck pain, she occ has an aura she may see dark spots and feels like someome hit her with a bat, acute in onset, for the last 6 months she has daily headaches and approx 16 migraine days a month that are moderate to severe, she is having vision changes, they can be positional and can happen at night and in the morning, she felt like she could not see the other day she felt like she was dri=unk she couldn;t make out words on the phone the other day and her eyes went out, can be in the temples as well, it can be multiple presentations, weather can trigger, she I getting a sleep apnea home test this week to test for OSA. No uterus/partial hysterectomy. No other focal neurologic deficits, associated symptoms, inciting events or modifiable factors.  From a thorough review of records and patient report, Medications tried that can be used in migraine/headache management greater than 3 months include: Lifestyle modification, headache diaries, better sleep hygiene, exercise, management of migraine  triggers, OTC and prescribed analgesics/nsaids such as ibuprofen , excedrin, alleve and others, Aimovig contraindicated due to constipation, Phenergan, Nurtec Maxalt, imitrex, Tylenol , ibuprofen , baclofen and Topamax, amitriptyline, metoprolol, amitriptyline    Reviewed notes, labs and imaging from outside physicians, which showed:  I reviewed 25 pages of notes from [facility] and sleep, it appears she has been on medications for headaches, including Phenergan, Nurtec Maxalt, imitrex, Tylenol , ibuprofen , baclofen and Topamax, amitriptyline, metoprolol, amitriptyline  previously followed by a migraine specialist in [place], MRI normal of the brain, no acute intracranial abnormality or findings to explain patient's symptoms identified, provider Rosina Gretta, NP, visit dates May 08, 2023, at which time her refills included Maxalt, Nurtec, Phenergan, for chronic fatigue they checked CMP, CBC, vitamin D, TSH, B12 and folate, results were not provided in the documentation from [facility].  05/16/2020: EXAM: MRI HEAD WITHOUT CONTRAST   TECHNIQUE: Multiplanar, multiecho pulse sequences of the brain and surrounding structures were obtained without intravenous contrast.   COMPARISON:  Prior head CT from 10/27/2015.   FINDINGS: Brain: Cerebral volume within normal limits for patient age. No focal parenchymal signal abnormality identified.   No abnormal foci of restricted diffusion to suggest acute or subacute ischemia. Gray-white matter differentiation well maintained. No encephalomalacia to suggest chronic infarction. No foci of susceptibility artifact to suggest acute or chronic intracranial hemorrhage.   No mass lesion, midline shift or mass effect. No hydrocephalus. No extra-axial fluid collection. Major dural sinuses are grossly patent.   Pituitary gland and suprasellar region are normal. Midline structures intact and normal.  Vascular: Major intracranial vascular flow  voids well maintained and normal in appearance.   Skull and upper cervical spine: Craniocervical junction normal. Visualized upper cervical spine within normal limits. Bone marrow signal intensity normal. No scalp soft tissue abnormality.   Sinuses/Orbits: Globes and orbital soft tissues within normal limits.   Paranasal sinuses are clear. No mastoid effusion. Inner ear structures normal.   Other: None.   IMPRESSION: Normal MRI of the brain. No acute intracranial abnormality or findings to explain patient's symptoms identified.  Most recent labs in EPIc below, unremarkable     Latest Ref Rng & Units 09/04/2021    3:32 PM 09/22/2019    3:04 PM 04/15/2018   12:13 PM  CBC  WBC 4.0 - 10.5 K/uL 5.4  6.1  4.6   Hemoglobin 12.0 - 15.0 g/dL 85.0  85.0  87.1   Hematocrit 36.0 - 46.0 % 43.7  43.5  37.6   Platelets 150 - 400 K/uL 254  329  165       Latest Ref Rng & Units 09/04/2021    3:32 PM 09/22/2019    3:04 PM 04/15/2018   12:13 PM  CMP  Glucose 70 - 99 mg/dL 85  93  77   BUN 6 - 20 mg/dL 12  10  7    Creatinine 0.44 - 1.00 mg/dL 9.33  9.21  9.36   Sodium 135 - 145 mmol/L 134  136  141   Potassium 3.5 - 5.1 mmol/L 3.6  2.9  2.9   Chloride 98 - 111 mmol/L 103  100  103   CO2 22 - 32 mmol/L 25  23  28    Calcium 8.9 - 10.3 mg/dL 9.3  9.7  8.6   Total Protein 6.5 - 8.1 g/dL 8.0   7.0   Total Bilirubin 0.3 - 1.2 mg/dL 1.6   1.4   Alkaline Phos 38 - 126 U/L 38   50   AST 15 - 41 U/L 20   12   ALT 0 - 44 U/L 27   11    From care everywhere September 10, 2023, ANA was negative, B12 was low normal to 91, vitamin D was low 19.7,\ CBC May 08, 2023 and CMP May 08, 2023 were normal as was hemoglobin A1c 5.2.  BUN was 10 and creatinine 0.75.   Review of Systems: Patient complains of symptoms per HPI as well as the following symptoms none. Pertinent negatives and positives per HPI. All others negative.   Social History   Socioeconomic History   Marital status: Married     Spouse name: Not on file   Number of children: 3   Years of education: Not on file   Highest education level: Not on file  Occupational History   Occupation: waitress  Tobacco Use   Smoking status: Former    Types: E-cigarettes   Smokeless tobacco: Never  Vaping Use   Vaping status: Every Day  Substance and Sexual Activity   Alcohol use: Yes    Comment: occ   Drug use: Not Currently    Types: Cocaine    Comment: last use march 2021   Sexual activity: Not on file  Other Topics Concern   Not on file  Social History Narrative   Pt in family    Pt is full time student   Social Drivers of Health   Financial Resource Strain: Low Risk  (11/22/2023)   Received from [facility]   Overall Financial Resource Strain (CARDIA)  Difficulty of Paying Living Expenses: Not hard at all  Food Insecurity: No Food Insecurity (11/22/2023)   Received from [facility]   Hunger Vital Sign    Within the past 12 months, you worried that your food would run out before you got the money to buy more.: Never true    Within the past 12 months, the food you bought just didn't last and you didn't have money to get more.: Never true  Transportation Needs: No Transportation Needs (11/22/2023)   Received from [facility]   PRAPARE - Transportation    Lack of Transportation (Medical): No    Lack of Transportation (Non-Medical): No  Physical Activity: Insufficiently Active (11/22/2023)   Received from [facility]   Exercise Vital Sign    On average, how many days per week do you engage in moderate to strenuous exercise (like a brisk walk)?: 2 days    On average, how many minutes do you engage in exercise at this level?: 30 min  Stress: Stress Concern Present (11/22/2023)   Received from [facility] - Occupational Stress Questionnaire    Feeling of Stress : To some extent  Social Connections: Socially Integrated (11/22/2023)   Received from [facility]   Social  Network    How would you rate your social network (family, work, friends)?: Good participation with social networks  Intimate Partner Violence: Not At Risk (11/22/2023)   Received from [facility]   HITS    Over the last 12 months how often did your partner physically hurt you?: Never    Over the last 12 months how often did your partner insult you or talk down to you?: Never    Over the last 12 months how often did your partner threaten you with physical harm?: Never    Over the last 12 months how often did your partner scream or curse at you?: Never    Family History  Adopted: Yes  Problem Relation Age of Onset   Migraines Neg Hx    Headache Neg Hx     Past Medical History:  Diagnosis Date   Anxiety    Migraine     Patient Active Problem List   Diagnosis Date Noted   Chronic migraine without aura without status migrainosus, not intractable 01/07/2024   Migraine with aura and without status migrainosus, not intractable 01/07/2024    Past Surgical History:  Procedure Laterality Date   ABDOMINAL HYSTERECTOMY     TUBAL LIGATION      Current Outpatient Medications  Medication Sig Dispense Refill   albuterol  (VENTOLIN  HFA) 108 (90 Base) MCG/ACT inhaler Inhale 1-2 puffs into the lungs every 6 (six) hours as needed for wheezing or shortness of breath. 18 g 0   Fremanezumab-vfrm (AJOVY) 225 MG/1.5ML SOAJ Inject 225 mg into the skin every 30 (thirty) days. Please run copay card: BIN# [id number] PCN# [id number] GRP# [id number] ID# [id number] EXP 07/16/2024 1.5 mL 11   ibuprofen  (ADVIL ,MOTRIN ) 800 MG tablet Take 1 tablet (800 mg total) by mouth every 8 (eight) hours as needed. 21 tablet 0   lisdexamfetamine (VYVANSE) 30 MG capsule Take 30 mg by mouth.     TRINTELLIX 5 MG TABS tablet Take by mouth.     Ubrogepant (UBRELVY) 100 MG TABS Take 1 tablet (100 mg total) by mouth every 2 (two) hours as needed. Maximum 200mg  a day.     Current Facility-Administered Medications  Medication Dose Route  Frequency Provider Last Rate  Last Admin   Fremanezumab-vfrm SOSY 225 mg  225 mg Subcutaneous Once         Allergies as of 01/07/2024 - Review Complete 01/07/2024  Allergen Reaction Noted   Bee venom Swelling and Hives 12/29/2014   Other  09/04/2021    Vitals: BP 112/70   Pulse 83   Ht 5' 11 (1.803 m)   Wt 192 lb (87.1 kg)   LMP 05/07/2020   BMI 26.78 kg/m  Last Weight:  Wt Readings from Last 1 Encounters:  01/07/24 192 lb (87.1 kg)   Last Height:   Ht Readings from Last 1 Encounters:  01/07/24 5' 11 (1.803 m)     Physical exam: Exam: Gen: NAD, conversant, well nourised, well groomed                     CV: RRR, no MRG. No Carotid Bruits. No peripheral edema, warm, nontender Eyes: Conjunctivae clear without exudates or hemorrhage  Neuro: Detailed Neurologic Exam  Speech:    Speech is normal; fluent and spontaneous with normal comprehension.  Cognition:    The patient is oriented to person, place, and time;     recent and remote memory intact;     language fluent;     normal attention, concentration,     fund of knowledge Cranial Nerves:    The pupils are equal, round, and reactive to light. The fundi are normal and spontaneous venous pulsations are present. Visual fields are full to finger confrontation. Extraocular movements are intact. Trigeminal sensation is intact and the muscles of mastication are normal. The face is symmetric. The palate elevates in the midline. Hearing intact. Voice is normal. Shoulder shrug is normal. The tongue has normal motion without fasciculations.   Coordination:    Normal finger to nose and heel to shin. Normal rapid alternating movements.   Gait:    Heel-toe and tandem gait are normal.   Motor Observation:    No asymmetry, no atrophy, and no involuntary movements noted. Tone:    Normal muscle tone.    Posture:    Posture is normal. normal erect    Strength:    Strength is V/V in the upper and lower limbs.       Sensation: intact to LT     Reflex Exam:  DTR's:    Deep tendon reflexes in the upper and lower extremities are normal bilaterally.   Toes:    The toes are downgoing bilaterally.   Clonus:    Clonus is absent.    Assessment/Plan:  Patient with chronic migraines. Given concerning features will repeat MRI brain  MRI of the brain w/wo contrast: MRI brain due to concerning symptoms of morning and nocturnal headaches, positional headaches,vision changes, worsening headaches  to look for space occupying mass, chiari or intracranial hypertension (pseudotumor), strokes, malignancies, vasculidities, demyelination(multiple sclerosis) or other  Prevention: Start Ajovy, if insurance likes emgality better we can just switch Acute management: Samples ubrelvy, can always mychart me and we can prescribe  From a thorough review of records and patient report, Medications tried that can be used in migraine/headache management greater than 3 months include: Lifestyle modification, headache diaries, better sleep hygiene, exercise, management of migraine triggers, OTC and prescribed analgesics/nsaids such as ibuprofen , excedrin, alleve and others, Aimovig contraindicated due to constipation, Phenergan, Nurtec Maxalt, imitrex, Tylenol , ibuprofen , baclofen and Topamax, amitriptyline, metoprolol, amitriptyline   Discussed:  There is increased risk for stroke in women with migraine with aura and a contraindication  for the combined contraceptive pill for use by women who have migraine with aura. The risk for women with migraine without aura is lower. However other risk factors like smoking are far more likely to increase stroke risk than migraine. There is a recommendation for no smoking and for the use of OCPs without estrogen such as progestogen only pills particularly for women with migraine with aura.SABRA People who have migraine headaches with auras may be 3 times more likely to have a stroke caused by a blood clot,  compared to migraine patients who don't see auras. Women who take hormone-replacement therapy may be 30 percent more likely to suffer a clot-based stroke than women not taking medication containing estrogen. Other risk factors like smoking and high blood pressure may be  much more important. And stroke is still a rare complication due to migraine aura and is controversial and lower doses may not cause a risk.   Orders Placed This Encounter  Procedures   MR BRAIN W WO CONTRAST   Meds ordered this encounter  Medications   Ubrogepant (UBRELVY) 100 MG TABS    Sig: Take 1 tablet (100 mg total) by mouth every 2 (two) hours as needed. Maximum 200mg  a day.   Fremanezumab-vfrm SOSY 225 mg   Fremanezumab-vfrm (AJOVY) 225 MG/1.5ML SOAJ    Sig: Inject 225 mg into the skin every 30 (thirty) days. Please run copay card: BIN# [id number] PCN# [id number] GRP# [id number] ID# [id number] EXP 07/16/2024    Dispense:  1.5 mL    Refill:  11    Please run copay card: BIN# [id number] PCN# [id number] GRP# [id number] ID# [id number] EXP 07/16/2024    Cc: Gretta Rosina DEL, NP,  [facility]  Onetha Epp, MD  [facility] [address] [place], [place] [address]  Phone [phone] Fax [phone]

## 2024-01-08 ENCOUNTER — Telehealth: Payer: Self-pay | Admitting: Neurology

## 2024-01-08 NOTE — Telephone Encounter (Signed)
 sent to GI they obtain [place] [phone]

## 2024-01-13 ENCOUNTER — Encounter: Payer: Self-pay | Admitting: Neurology

## 2024-01-14 ENCOUNTER — Other Ambulatory Visit (HOSPITAL_COMMUNITY): Payer: Self-pay

## 2024-01-14 ENCOUNTER — Telehealth: Payer: Self-pay | Admitting: *Deleted

## 2024-01-14 ENCOUNTER — Telehealth: Payer: Self-pay

## 2024-01-14 NOTE — Telephone Encounter (Signed)
 Pharmacy Patient Advocate Encounter  Received notification from [facility] that Prior Authorization for AJOVY  (fremanezumab -vfrm) injection 225MG /1.5ML auto-injectors has been APPROVED from 12/15/2023 to 01/13/2025. Ran test claim, Copay is $24.98. This test claim was processed through [facility]- copay amounts may vary at other pharmacies due to pharmacy/plan contracts, or as the patient moves through the different stages of their insurance plan.   PA #/Case ID/Reference #: PA Case ID #: [id number]

## 2024-01-14 NOTE — Telephone Encounter (Signed)
 Pharmacy Patient Advocate Encounter   Received notification from Physician's Office that prior authorization for AJOVY  (fremanezumab -vfrm) injection 225MG /1.5ML auto-injectors is required/requested.   Insurance verification completed.   The patient is insured through [employer] .   Per test claim: PA required; PA submitted to above mentioned insurance via CoverMyMeds Key/confirmation #/EOC [id number] Status is pending

## 2024-01-14 NOTE — Telephone Encounter (Signed)
 Pt needs Ajovy  PA. Unsure if there's a request in the bulk fax or not.

## 2024-02-15 ENCOUNTER — Other Ambulatory Visit

## 2024-03-13 ENCOUNTER — Other Ambulatory Visit

## 2024-04-22 NOTE — Telephone Encounter (Signed)
 Called and left voicemail for patient to reschedule appointment on 11/12 with Dr Ines.  If patient calls back, they can be rescheduled with Dr Gregg

## 2024-04-30 ENCOUNTER — Ambulatory Visit: Admitting: Neurology

## 2024-05-28 ENCOUNTER — Ambulatory Visit: Admitting: Neurology
